# Patient Record
Sex: Male | Born: 1940 | Race: Black or African American | Hispanic: No | Marital: Married | Smoking: Current some day smoker
Health system: Southern US, Community
[De-identification: ages and names within clinical notes are randomized; demographics above are authoritative.]

## PROBLEM LIST (undated history)

## (undated) DIAGNOSIS — I10 Essential (primary) hypertension: Secondary | ICD-10-CM

## (undated) DIAGNOSIS — I2510 Atherosclerotic heart disease of native coronary artery without angina pectoris: Secondary | ICD-10-CM

## (undated) DIAGNOSIS — M109 Gout, unspecified: Secondary | ICD-10-CM

## (undated) DIAGNOSIS — E669 Obesity, unspecified: Secondary | ICD-10-CM

## (undated) DIAGNOSIS — I429 Cardiomyopathy, unspecified: Secondary | ICD-10-CM

## (undated) DIAGNOSIS — I639 Cerebral infarction, unspecified: Secondary | ICD-10-CM

## (undated) DIAGNOSIS — C189 Malignant neoplasm of colon, unspecified: Secondary | ICD-10-CM

## (undated) HISTORY — DX: Essential (primary) hypertension: I10

## (undated) HISTORY — PX: COLON SURGERY: SHX602

## (undated) HISTORY — DX: Cardiomyopathy, unspecified: I42.9

## (undated) HISTORY — DX: Atherosclerotic heart disease of native coronary artery without angina pectoris: I25.10

---

## 2000-11-13 ENCOUNTER — Ambulatory Visit (HOSPITAL_COMMUNITY): Admission: RE | Admit: 2000-11-13 | Discharge: 2000-11-13 | Payer: Self-pay | Admitting: General Surgery

## 2000-11-14 ENCOUNTER — Inpatient Hospital Stay (HOSPITAL_COMMUNITY): Admission: RE | Admit: 2000-11-14 | Discharge: 2000-11-20 | Payer: Self-pay | Admitting: General Surgery

## 2000-11-17 ENCOUNTER — Encounter: Payer: Self-pay | Admitting: Orthopedic Surgery

## 2000-11-20 ENCOUNTER — Encounter: Payer: Self-pay | Admitting: General Surgery

## 2000-12-26 ENCOUNTER — Encounter: Admission: RE | Admit: 2000-12-26 | Discharge: 2000-12-26 | Payer: Self-pay | Admitting: Oncology

## 2000-12-26 ENCOUNTER — Encounter (HOSPITAL_COMMUNITY): Admission: RE | Admit: 2000-12-26 | Discharge: 2001-01-25 | Payer: Self-pay | Admitting: Oncology

## 2001-06-29 ENCOUNTER — Encounter (HOSPITAL_COMMUNITY): Admission: RE | Admit: 2001-06-29 | Discharge: 2001-07-29 | Payer: Self-pay | Admitting: Oncology

## 2001-09-07 ENCOUNTER — Encounter: Admission: RE | Admit: 2001-09-07 | Discharge: 2001-09-07 | Payer: Self-pay | Admitting: Oncology

## 2001-09-07 ENCOUNTER — Encounter (HOSPITAL_COMMUNITY): Admission: RE | Admit: 2001-09-07 | Discharge: 2001-10-07 | Payer: Self-pay | Admitting: Oncology

## 2001-11-25 ENCOUNTER — Ambulatory Visit (HOSPITAL_COMMUNITY): Admission: RE | Admit: 2001-11-25 | Discharge: 2001-11-25 | Payer: Self-pay | Admitting: General Surgery

## 2001-12-29 ENCOUNTER — Encounter (HOSPITAL_COMMUNITY): Admission: RE | Admit: 2001-12-29 | Discharge: 2002-01-28 | Payer: Self-pay | Admitting: Oncology

## 2001-12-29 ENCOUNTER — Encounter: Admission: RE | Admit: 2001-12-29 | Discharge: 2001-12-29 | Payer: Self-pay | Admitting: Oncology

## 2002-01-29 ENCOUNTER — Encounter: Admission: RE | Admit: 2002-01-29 | Discharge: 2002-01-29 | Payer: Self-pay | Admitting: Oncology

## 2002-07-20 ENCOUNTER — Encounter (HOSPITAL_COMMUNITY): Admission: RE | Admit: 2002-07-20 | Discharge: 2002-08-19 | Payer: Self-pay | Admitting: Oncology

## 2002-07-20 ENCOUNTER — Encounter: Admission: RE | Admit: 2002-07-20 | Discharge: 2002-07-20 | Payer: Self-pay | Admitting: Oncology

## 2003-01-24 ENCOUNTER — Encounter (HOSPITAL_COMMUNITY): Admission: RE | Admit: 2003-01-24 | Discharge: 2003-01-27 | Payer: Self-pay | Admitting: Oncology

## 2003-01-24 ENCOUNTER — Encounter: Admission: RE | Admit: 2003-01-24 | Discharge: 2003-01-24 | Payer: Self-pay | Admitting: Oncology

## 2003-04-18 ENCOUNTER — Encounter (HOSPITAL_COMMUNITY): Admission: RE | Admit: 2003-04-18 | Discharge: 2003-05-18 | Payer: Self-pay | Admitting: Oncology

## 2003-04-18 ENCOUNTER — Encounter: Admission: RE | Admit: 2003-04-18 | Discharge: 2003-04-18 | Payer: Self-pay | Admitting: Oncology

## 2003-07-11 ENCOUNTER — Encounter (HOSPITAL_COMMUNITY): Admission: RE | Admit: 2003-07-11 | Discharge: 2003-08-10 | Payer: Self-pay | Admitting: Oncology

## 2003-07-11 ENCOUNTER — Encounter: Admission: RE | Admit: 2003-07-11 | Discharge: 2003-07-11 | Payer: Self-pay | Admitting: Oncology

## 2003-10-21 ENCOUNTER — Encounter: Admission: RE | Admit: 2003-10-21 | Discharge: 2003-10-21 | Payer: Self-pay | Admitting: Oncology

## 2003-10-21 ENCOUNTER — Encounter (HOSPITAL_COMMUNITY): Admission: RE | Admit: 2003-10-21 | Discharge: 2003-11-20 | Payer: Self-pay | Admitting: Oncology

## 2004-01-20 ENCOUNTER — Encounter (HOSPITAL_COMMUNITY): Admission: RE | Admit: 2004-01-20 | Discharge: 2004-01-27 | Payer: Self-pay | Admitting: Oncology

## 2004-01-20 ENCOUNTER — Encounter: Admission: RE | Admit: 2004-01-20 | Discharge: 2004-01-27 | Payer: Self-pay | Admitting: Oncology

## 2004-11-13 ENCOUNTER — Encounter: Admission: RE | Admit: 2004-11-13 | Discharge: 2004-11-13 | Payer: Self-pay | Admitting: Oncology

## 2004-11-13 ENCOUNTER — Ambulatory Visit (HOSPITAL_COMMUNITY): Payer: Self-pay | Admitting: Oncology

## 2004-11-13 ENCOUNTER — Encounter (HOSPITAL_COMMUNITY): Admission: RE | Admit: 2004-11-13 | Discharge: 2004-12-13 | Payer: Self-pay | Admitting: Oncology

## 2005-02-19 ENCOUNTER — Encounter: Admission: RE | Admit: 2005-02-19 | Discharge: 2005-02-19 | Payer: Self-pay | Admitting: Oncology

## 2005-02-19 ENCOUNTER — Ambulatory Visit (HOSPITAL_COMMUNITY): Payer: Self-pay | Admitting: Oncology

## 2005-02-19 ENCOUNTER — Encounter (HOSPITAL_COMMUNITY): Admission: RE | Admit: 2005-02-19 | Discharge: 2005-03-21 | Payer: Self-pay | Admitting: Oncology

## 2005-05-22 ENCOUNTER — Ambulatory Visit (HOSPITAL_COMMUNITY): Payer: Self-pay | Admitting: Oncology

## 2005-05-22 ENCOUNTER — Encounter (HOSPITAL_COMMUNITY): Admission: RE | Admit: 2005-05-22 | Discharge: 2005-06-21 | Payer: Self-pay | Admitting: Oncology

## 2005-05-22 ENCOUNTER — Encounter: Admission: RE | Admit: 2005-05-22 | Discharge: 2005-05-22 | Payer: Self-pay | Admitting: Oncology

## 2005-06-25 ENCOUNTER — Ambulatory Visit (HOSPITAL_COMMUNITY): Admission: RE | Admit: 2005-06-25 | Discharge: 2005-06-25 | Payer: Self-pay | Admitting: General Surgery

## 2005-08-20 ENCOUNTER — Encounter: Admission: RE | Admit: 2005-08-20 | Discharge: 2005-08-20 | Payer: Self-pay | Admitting: Oncology

## 2005-08-20 ENCOUNTER — Ambulatory Visit (HOSPITAL_COMMUNITY): Payer: Self-pay | Admitting: Oncology

## 2005-08-20 ENCOUNTER — Encounter (HOSPITAL_COMMUNITY): Admission: RE | Admit: 2005-08-20 | Discharge: 2005-09-19 | Payer: Self-pay | Admitting: Oncology

## 2008-01-12 ENCOUNTER — Emergency Department (HOSPITAL_COMMUNITY): Admission: EM | Admit: 2008-01-12 | Discharge: 2008-01-13 | Payer: Self-pay | Admitting: Emergency Medicine

## 2010-09-14 NOTE — H&P (Signed)
NAME:  Carlos Joseph, Carlos Joseph NO.:  [id number]   MEDICAL RECORD NO.:  [id number]           PATIENT TYPE:   LOCATION:                                 FACILITY:   PHYSICIAN:  Dalia Heading, M.D.       DATE OF BIRTH:   DATE OF ADMISSION:  06/25/2005  DATE OF DISCHARGE:  LH                                HISTORY & PHYSICAL   CHIEF COMPLAINT:  History of colon carcinoma.   HISTORY OF PRESENT ILLNESS:  The patient is a 70 year old black male status  post a right hemicolectomy in July 2002 for a Duke's B2 adenocarcinoma of  the cecum who now presents for followup colonoscopy. He denies any  hematochezia, nausea, vomiting, fever, weight loss or lightheadedness.   PAST MEDICAL HISTORY:  Hypertension.   PAST SURGICAL HISTORY:  As noted above.   CURRENT MEDICATIONS:  Blood pressure pill.   ALLERGIES:  No known drug allergies.   REVIEW OF SYSTEMS:  The patient denies any cardiopulmonary difficulties or  bleeding disorders.   PHYSICAL EXAMINATION:  GENERAL:  The patient is a well-developed, well-  nourished, black male in no acute distress.  LUNGS:  Clear to auscultation with equal breath sounds bilaterally.  HEART:  Reveals regular rate and rhythm without S3, S4, or murmurs.  ABDOMEN:  Soft, nontender, nondistended. No hepatosplenomegaly or masses are  noted.  RECTAL:  Deferred to the procedure.   IMPRESSION:  History of colon carcinoma.   PLAN:  The patient is scheduled for colonoscopy on June 25, 2005. The  risks and benefits of the procedure including bleeding and perforation were  fully explained to the patient, who gave informed consent.      Dalia Heading, M.D.  Electronically Signed     MAJ/MEDQ  D:  06/06/2005  T:  06/06/2005  Job:  [id number]   cc:   [facility]  Fax: [phone]   Ladona Horns. Mariel Sleet, MD  Fax: [phone]   Angus G. Renard Matter, MD  Fax: [phone]

## 2010-09-14 NOTE — H&P (Signed)
[facility]  Patient:    Carlos Joseph, Carlos Joseph Visit Number: [id number] MRN: [id number]          Service Type: REC Location: [facility] Attending Physician:  Elisabeth Most Dictated by:   Franky Macho, M.D. Admit Date:  09/07/2001 Discharge Date: 10/07/2001   CC:         Butch Penny, M.D.  Unk Pinto, M.D.   History and Physical  AGE:  70  CHIEF COMPLAINT:  Colon carcinoma.  HISTORY OF PRESENT ILLNESS:  The patient is a 70 year old black male who is status post a right hemicolectomy in July of 2002, who now presents for a follow up colonoscopy.  He had a Dukes B-II lesion.  He did not receive chemotherapy.  He states that his only complaint is that he goes to the bathroom frequently.  Denies any hematochezia, nausea, vomiting, fever, weight loss, or light-headedness.  PAST MEDICAL HISTORY:  Unremarkable.  PAST SURGICAL HISTORY:  As noted above.  CURRENT MEDICATIONS:  None listed.  ALLERGIES:  No known drug allergies.  REVIEW OF SYSTEMS:  He does occasionally smoke cigars.  He denies any significant alcohol use.  He denies any other cardiopulmonary difficulties or bleeding disorders.  PHYSICAL EXAMINATION:  GENERAL:  The patient is a well-developed and well-nourished black male in no acute distress.  VITAL SIGNS:  He is afebrile and vital signs are stable.  LUNGS:  Clear to auscultation with equal breath sounds bilaterally.  HEART:  Examination reveals a regular rate and rhythm without S3, S4, or murmurs.  ABDOMEN:  Soft, nontender, and nondistended.  No hepatosplenomegaly, masses, or hernias are identified.  RECTAL:  Examination was deferred until the procedure.  IMPRESSION:  Colon carcinoma.  PLAN:  The patient is scheduled to undergo a colonoscopy on November 25, 2001. The risks and benefits of the procedure including bleeding and perforation were fully explained to the patient, who gave informed consent. Dictated by:   Franky Macho, M.D. Attending Physician:  Elisabeth Most DD:  11/12/01 TD:  11/16/01 Job: [id number]

## 2012-04-01 ENCOUNTER — Inpatient Hospital Stay (HOSPITAL_COMMUNITY)
Admission: EM | Admit: 2012-04-01 | Discharge: 2012-04-04 | DRG: 250 | Disposition: A | Discharge status: Home Health | Payer: Medicare Other | Attending: Internal Medicine | Admitting: Internal Medicine

## 2012-04-01 ENCOUNTER — Encounter (HOSPITAL_COMMUNITY): Payer: Self-pay | Admitting: Emergency Medicine

## 2012-04-01 ENCOUNTER — Emergency Department (HOSPITAL_COMMUNITY): Payer: Medicare Other

## 2012-04-01 DIAGNOSIS — R0902 Hypoxemia: Secondary | ICD-10-CM

## 2012-04-01 DIAGNOSIS — Z955 Presence of coronary angioplasty implant and graft: Secondary | ICD-10-CM

## 2012-04-01 DIAGNOSIS — J810 Acute pulmonary edema: Secondary | ICD-10-CM

## 2012-04-01 DIAGNOSIS — I2510 Atherosclerotic heart disease of native coronary artery without angina pectoris: Secondary | ICD-10-CM | POA: Diagnosis present

## 2012-04-01 DIAGNOSIS — J9600 Acute respiratory failure, unspecified whether with hypoxia or hypercapnia: Secondary | ICD-10-CM | POA: Diagnosis present

## 2012-04-01 DIAGNOSIS — I429 Cardiomyopathy, unspecified: Secondary | ICD-10-CM | POA: Diagnosis present

## 2012-04-01 DIAGNOSIS — F17200 Nicotine dependence, unspecified, uncomplicated: Secondary | ICD-10-CM | POA: Diagnosis present

## 2012-04-01 DIAGNOSIS — E876 Hypokalemia: Secondary | ICD-10-CM | POA: Diagnosis not present

## 2012-04-01 DIAGNOSIS — I240 Acute coronary thrombosis not resulting in myocardial infarction: Secondary | ICD-10-CM

## 2012-04-01 DIAGNOSIS — I10 Essential (primary) hypertension: Secondary | ICD-10-CM | POA: Diagnosis present

## 2012-04-01 DIAGNOSIS — E669 Obesity, unspecified: Secondary | ICD-10-CM | POA: Diagnosis present

## 2012-04-01 DIAGNOSIS — D649 Anemia, unspecified: Secondary | ICD-10-CM

## 2012-04-01 DIAGNOSIS — Z85038 Personal history of other malignant neoplasm of large intestine: Secondary | ICD-10-CM

## 2012-04-01 DIAGNOSIS — I509 Heart failure, unspecified: Secondary | ICD-10-CM | POA: Diagnosis present

## 2012-04-01 DIAGNOSIS — I5021 Acute systolic (congestive) heart failure: Principal | ICD-10-CM | POA: Diagnosis present

## 2012-04-01 DIAGNOSIS — R0602 Shortness of breath: Secondary | ICD-10-CM

## 2012-04-01 DIAGNOSIS — I517 Cardiomegaly: Secondary | ICD-10-CM

## 2012-04-01 DIAGNOSIS — D539 Nutritional anemia, unspecified: Secondary | ICD-10-CM | POA: Diagnosis present

## 2012-04-01 DIAGNOSIS — J9801 Acute bronchospasm: Secondary | ICD-10-CM

## 2012-04-01 DIAGNOSIS — Z79899 Other long term (current) drug therapy: Secondary | ICD-10-CM

## 2012-04-01 DIAGNOSIS — I160 Hypertensive urgency: Secondary | ICD-10-CM

## 2012-04-01 HISTORY — DX: Obesity, unspecified: E66.9

## 2012-04-01 HISTORY — DX: Malignant neoplasm of colon, unspecified: C18.9

## 2012-04-01 LAB — FERRITIN: Ferritin: 364 ng/mL — ABNORMAL HIGH (ref 22–322)

## 2012-04-01 LAB — RETICULOCYTES
RBC.: 3.36 MIL/uL — ABNORMAL LOW (ref 4.22–5.81)
Retic Ct Pct: 3 % (ref 0.4–3.1)

## 2012-04-01 LAB — CBC WITH DIFFERENTIAL/PLATELET
Basophils Relative: 0 % (ref 0–1)
Eosinophils Relative: 1 % (ref 0–5)
HCT: 34.5 % — ABNORMAL LOW (ref 39.0–52.0)
Hemoglobin: 11.6 g/dL — ABNORMAL LOW (ref 13.0–17.0)
MCHC: 33.6 g/dL (ref 30.0–36.0)
MCV: 102.1 fL — ABNORMAL HIGH (ref 78.0–100.0)
Monocytes Absolute: 0.6 10*3/uL (ref 0.1–1.0)
Monocytes Relative: 8 % (ref 3–12)
Neutro Abs: 5.7 10*3/uL (ref 1.7–7.7)

## 2012-04-01 LAB — IRON AND TIBC: Iron: 46 ug/dL (ref 42–135)

## 2012-04-01 LAB — FOLATE: Folate: >20 ng/mL

## 2012-04-01 LAB — PRO B NATRIURETIC PEPTIDE: Pro B Natriuretic peptide (BNP): 8916 pg/mL — ABNORMAL HIGH (ref 0–125)

## 2012-04-01 LAB — COMPREHENSIVE METABOLIC PANEL
Albumin: 2.9 g/dL — ABNORMAL LOW (ref 3.5–5.2)
BUN: 12 mg/dL (ref 6–23)
CO2: 22 mEq/L (ref 19–32)
Chloride: 109 mEq/L (ref 96–112)
Creatinine, Ser: 1.04 mg/dL (ref 0.50–1.35)
GFR calc non Af Amer: 69 mL/min — ABNORMAL LOW (ref >90)
Total Bilirubin: 0.5 mg/dL (ref 0.3–1.2)

## 2012-04-01 LAB — TSH: TSH: 0.703 u[IU]/mL (ref 0.350–4.500)

## 2012-04-01 LAB — HEMOGLOBIN A1C: Mean Plasma Glucose: 111 mg/dL (ref <117)

## 2012-04-01 LAB — TROPONIN I
Troponin I: <0.3 ng/mL (ref <0.30)
Troponin I: <0.3 ng/mL (ref <0.30)

## 2012-04-01 MED ORDER — HEPARIN SODIUM (PORCINE) 5000 UNIT/ML IJ SOLN
5000.0000 [IU] | Freq: Three times a day (TID) | INTRAMUSCULAR | Status: DC
  Administered 2012-04-01 – 2012-04-03 (×6): 5000 [IU] via SUBCUTANEOUS
  Filled 2012-04-01 (×6): qty 1

## 2012-04-01 MED ORDER — SODIUM CHLORIDE 0.9 % IJ SOLN: 3.0000 mL | INTRAMUSCULAR | Status: DC | PRN

## 2012-04-01 MED ORDER — IPRATROPIUM BROMIDE 0.02 % IN SOLN
0.5000 mg | Freq: Once | RESPIRATORY_TRACT | Status: AC
  Administered 2012-04-01: 0.5 mg via RESPIRATORY_TRACT
  Filled 2012-04-01: qty 2.5

## 2012-04-01 MED ORDER — NITROGLYCERIN IN D5W 200-5 MCG/ML-% IV SOLN
5.0000 ug/min | Freq: Once | INTRAVENOUS | Status: AC
  Administered 2012-04-01: 5 ug/min via INTRAVENOUS
  Filled 2012-04-01: qty 250

## 2012-04-01 MED ORDER — NITROGLYCERIN IN D5W 200-5 MCG/ML-% IV SOLN
2.0000 ug/min | INTRAVENOUS | Status: DC
Start: 2012-04-01 — End: 2012-04-02

## 2012-04-01 MED ORDER — ACETAMINOPHEN 325 MG PO TABS: 650.0000 mg | ORAL_TABLET | ORAL | Status: DC | PRN

## 2012-04-01 MED ORDER — ONDANSETRON HCL 4 MG/2ML IJ SOLN: 4.0000 mg | Freq: Four times a day (QID) | INTRAMUSCULAR | Status: DC | PRN

## 2012-04-01 MED ORDER — LISINOPRIL 5 MG PO TABS
5.0000 mg | ORAL_TABLET | Freq: Every day | ORAL | Status: DC
  Administered 2012-04-01: 5 mg via ORAL
  Filled 2012-04-01 (×4): qty 1

## 2012-04-01 MED ORDER — ALBUTEROL SULFATE (5 MG/ML) 0.5% IN NEBU
5.0000 mg | INHALATION_SOLUTION | Freq: Once | RESPIRATORY_TRACT | Status: AC
  Administered 2012-04-01: 5 mg via RESPIRATORY_TRACT
  Filled 2012-04-01: qty 1

## 2012-04-01 MED ORDER — CARVEDILOL 3.125 MG PO TABS
3.1250 mg | ORAL_TABLET | Freq: Two times a day (BID) | ORAL | Status: DC
  Administered 2012-04-01 – 2012-04-02 (×3): 3.125 mg via ORAL
  Filled 2012-04-01 (×3): qty 1

## 2012-04-01 MED ORDER — FUROSEMIDE 10 MG/ML IJ SOLN: 60.0000 mg | Freq: Once | INTRAMUSCULAR | Status: DC

## 2012-04-01 MED ORDER — SODIUM CHLORIDE 0.9 % IJ SOLN
3.0000 mL | Freq: Two times a day (BID) | INTRAMUSCULAR | Status: DC
  Administered 2012-04-01 – 2012-04-03 (×5): 3 mL via INTRAVENOUS

## 2012-04-01 MED ORDER — FUROSEMIDE 10 MG/ML IJ SOLN
60.0000 mg | Freq: Two times a day (BID) | INTRAMUSCULAR | Status: DC
  Administered 2012-04-01 – 2012-04-02 (×2): 60 mg via INTRAVENOUS
  Filled 2012-04-01 (×3): qty 6

## 2012-04-01 MED ORDER — FUROSEMIDE 10 MG/ML IJ SOLN
80.0000 mg | Freq: Once | INTRAMUSCULAR | Status: AC
  Administered 2012-04-01: 80 mg via INTRAVENOUS
  Filled 2012-04-01: qty 8

## 2012-04-01 MED ORDER — METHYLPREDNISOLONE SODIUM SUCC 125 MG IJ SOLR
125.0000 mg | Freq: Once | INTRAMUSCULAR | Status: AC
  Administered 2012-04-01: 125 mg via INTRAVENOUS
  Filled 2012-04-01: qty 2

## 2012-04-01 NOTE — H&P (Signed)
[facility] History and Physical  Carlos Joseph AVW:[id number] DOB: 09-22-1940 DOA: 04/01/2012   Chief Complaint: Dyspnea.  HPI: Carlos Joseph is a 71 y.o. male presents with the above symptoms for the last 3 days. He denies any chest pain, palpitations. He does describe some chest tightness but this was related to his dyspnea. There is a nonproductive cough and he has been wheezing. When he came to the emergency room, he was initially treated with bronchodilators and IV steroids. Chest x-ray, however was revealing for pulmonary edema and he has now been given intravenous Lasix and is on a nitroglycerin drip, he feels improved. He does have a history of hypertension and has not seen a physician for many years and does not take any antihypertensive medications. Currently, his blood pressure is significantly elevated.   Review of Systems:  Apart from history of present illness, other systems negative.  Past Medical History  Diagnosis Date  . Hypertension   . Cancer, colon cancer.     Past Surgical History  Procedure Date  . Colon surgery    Social History:  He is married and lives with his wife. He does not smoke cigarettes but he chews tobacco. He occasionally drinks alcohol.  No Known Allergies  History reviewed. No pertinent family history. no family history of early coronary artery disease.  Prior to Admission medications   Medication Sig Start Date End Date Taking? Authorizing Provider  fish oil-omega-3 fatty acids 1000 MG capsule Take 2 g by mouth daily.   Yes Historical Provider, MD  Multiple Vitamin (MULTIVITAMIN WITH MINERALS) TABS Take 1 tablet by mouth daily.   Yes Historical Provider, MD  Pseudoeph-Doxylamine-DM-APAP (DAYQUIL/NYQUIL COLD/FLU RELIEF PO) Take 2 tablets by mouth every 4 (four) hours as needed. Cold Symptoms   Yes Historical Provider, MD   Physical Exam: Filed Vitals:   04/01/12 1029 04/01/12 1053 04/01/12 1100 04/01/12 1130  BP:  169/129 174/112  175/113  Pulse:  110    Temp:      Resp:  24 25 26   Height:      Weight:      SpO2: 95%        General:  He looks systemically well. He is obese. He is able to speak in sentences.  Eyes: No pallor. No jaundice.  ENT: No abnormalities.  Neck: No lymphadenopathy.  Cardiovascular: Heart sounds are present with a resting sinus tachycardia. He does not appear to have a gallop rhythm. Jugular venous pressure is not elevated.  Respiratory: Lung fields show scattered wheezing, otherwise clear. There are no significant crackles.  Abdomen: Soft, nontender, no hepatosplenomegaly.  Skin: No rashes.  Musculoskeletal: No major abnormalities.  Psychiatric: Appropriate affect.  Neurologic: Alert and orientated, no focal neurological signs.  Labs on Admission:  Basic Metabolic Panel:  Lab 04/01/12 8295  NA 141  K 3.5  CL 109  CO2 22  GLUCOSE 138*  BUN 12  CREATININE 1.04  CALCIUM 9.0  MG --  PHOS --   Liver Function Tests:  Lab 04/01/12 0818  AST 32  ALT 25  ALKPHOS 96  BILITOT 0.5  PROT 6.6  ALBUMIN 2.9*     CBC:  Lab 04/01/12 0818  WBC 7.4  NEUTROABS 5.7  HGB 11.6*  HCT 34.5*  MCV 102.1*  PLT 222   Cardiac Enzymes:  Lab 04/01/12 0818  CKTOTAL --  CKMB --  CKMBINDEX --  TROPONINI <0.30    BNP (last 3 results)  Basename 04/01/12 0818  PROBNP  8916.0*     Radiological Exams on Admission: Dg Chest Portable 1 View  04/01/2012  *RADIOLOGY REPORT*  Clinical Data: Shortness of breath, history hypertension, carcinoma of the colon  PORTABLE CHEST - 1 VIEW  Comparison: Portable exam 0937 hours without priors for comparison. Correlation:  CT chest 07/12/2003  Findings: Enlargement of cardiac silhouette. Tortuous aorta with atherosclerotic calcification. Bilateral perihilar to basilar infiltrates question edema versus infection. No pleural effusion or pneumothorax. Bones unremarkable.  IMPRESSION: Enlargement of cardiac silhouette. Bilateral perihilar to  basilar infiltrates question pulmonary edema versus infection.   Original Report Authenticated By: Ulyses Southward, M.D.     EKG: Independently reviewed. 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  Assessment/Plan   1. Acute congestive heart failure. 2. Uncontrolled hypertension. 3. Obesity. 4. Microcytic anemia. Plan: 1. Admit to step down unit for today. 2. Intravenous diuretics. ACE inhibitor. Beta blocker. 3. Investigate microcytic anemia. 4. Cardiology consultation. Echocardiogram. Serial cardiac enzymes. Further recommendations will depend on patient's hospital progress.    Code Status: Full code.  Family Communication: Discussed plan with patient and his wife at the bedside.   Disposition Plan: Home in medically stable.   Time spent: 45 minutes.  Wilson Singer [facility] Pager [phone].  If 7PM-7AM, please contact night-coverage [email or web address] Password [email or web address] 04/01/2012, 11:58 AM

## 2012-04-01 NOTE — Progress Notes (Signed)
*  PRELIMINARY RESULTS* Echocardiogram 2D Echocardiogram has been performed.  Carlos Joseph 04/01/2012, 2:31 PM

## 2012-04-01 NOTE — ED Provider Notes (Signed)
History   This chart was scribed for Ward Givens, MD by Charolett Bumpers, ED Scribe. The patient was seen in room APA07/APA07. Patient's care was started at 0904.   CSN: [id number]  Arrival date & time 04/01/12  4098   First MD Initiated Contact with Patient 04/01/12 [phone]      Chief Complaint  Patient presents with  . Shortness of Breath  . Chest Pain    Level V caveat for respiratory distress  Carlos Joseph is a 71 y.o. male who presents to the Emergency Department complaining of SOB that started over the past week. He reports an associated dry cough, wheezing, chest tightness, fever and chills. He denies any chest pain, leg swelling, dizziness, light-headedness. He states his chest tightness is aggravated with coughing. He also reports diaphoresis at night. He denies any h/o similar symptoms. He denies any aggravation of his symptoms with laying down or ambulating. He is not taking any medications for HTN currently and hasn't seen a doctor or taken his blood pressure medication for several years.  Patient is a 71 y.o. male presenting with shortness of breath. The history is provided by the patient and the spouse. No language interpreter was used.  Shortness of Breath  The current episode started 5 to 7 days ago. The onset was gradual. The problem is moderate. Nothing relieves the symptoms. Associated symptoms include a fever, cough, shortness of breath and wheezing. Pertinent negatives include no chest pain.    PCP: Dr. Margo Aye and Renard Matter (has not seen in years).   Past Medical History  Diagnosis Date  . Hypertension   . Cancer     Past Surgical History  Procedure Date  . Colon surgery     History reviewed. No pertinent family history.  History  Substance Use Topics  . Smoking status: Current Every Day Smoker    Types: Cigars  . Smokeless tobacco: Current User    Types: Chew  . Alcohol Use: Yes     Comment: liquor-half pint twice a week   He smokes and chews  cigars daily.  Lives at home Lives with spouse Drinks 1/2 pint a day  Review of Systems  Constitutional: Positive for fever, chills and diaphoresis.  Respiratory: Positive for cough, chest tightness, shortness of breath and wheezing.   Cardiovascular: Negative for chest pain and leg swelling.  Neurological: Negative for dizziness and light-headedness.  All other systems reviewed and are negative.    Allergies  Review of patient's allergies indicates no known allergies.  Home Medications  No current outpatient prescriptions on file.  BP 180/117  Pulse 123  Temp 98.6 F (37 C)  Resp 28  Ht 5\' 11"  (1.803 m)  Wt 225 lb (102.059 kg)  BMI 31.38 kg/m2  SpO2 91%  Vital signs normal except hypertension, tachycardia, borderline hypoxia on pulse ox   Physical Exam  Nursing note and vitals reviewed. Constitutional: He is oriented to person, place, and time. He appears well-developed and well-nourished. No distress.       obese  HENT:  Head: Normocephalic and atraumatic.  Right Ear: External ear normal.  Left Ear: External ear normal.  Mouth/Throat: Oropharynx is clear and moist.  Eyes: Conjunctivae normal and EOM are normal. Pupils are equal, round, and reactive to light.  Neck: Normal range of motion. Neck supple. No tracheal deviation present.  Cardiovascular: Normal rate, regular rhythm and normal heart sounds.   No murmur heard. Pulmonary/Chest: Effort normal. No respiratory distress. He has wheezes.  He has no rales.       Retractions, diffuse prolonged expiratory wheezes, audible wheezing, has difficulty speaking b/o SOB   Abdominal: Soft. Bowel sounds are normal. He exhibits distension. There is no tenderness. There is no rebound and no guarding.       Abdominal appears distended, but is soft.   Musculoskeletal: Normal range of motion. He exhibits edema. He exhibits no tenderness.       Trace pitting edema in lower extremities.   Neurological: He is alert and oriented  to person, place, and time.  Skin: Skin is warm and dry.  Psychiatric: He has a normal mood and affect. His behavior is normal.    ED Course  Procedures (including critical care time)   Medications  albuterol (PROVENTIL) (5 MG/ML) 0.5% nebulizer solution 5 mg (5 mg Nebulization Given 04/01/12 0942)  ipratropium (ATROVENT) nebulizer solution 0.5 mg (0.5 mg Nebulization Given 04/01/12 0942)  nitroGLYCERIN 0.2 mg/mL in dextrose 5 % infusion (10 mcg/min Intravenous Rate/Dose Change 04/01/12 1052)  methylPREDNISolone sodium succinate (SOLU-MEDROL) 125 mg/2 mL injection 125 mg (125 mg Intravenous Given 04/01/12 1000)  albuterol (PROVENTIL) (5 MG/ML) 0.5% nebulizer solution 5 mg (5 mg Nebulization Given 04/01/12 1028)  ipratropium (ATROVENT) nebulizer solution 0.5 mg (0.5 mg Nebulization Given 04/01/12 1028)  furosemide (LASIX) injection 80 mg (80 mg Intravenous Given 04/01/12 1046)     DIAGNOSTIC STUDIES: Oxygen Saturation is 91% on room air, borderline by my interpretation.    COORDINATION OF CARE:  09:30-Discussed planned course of treatment with the patient including a breathing treatment, steroids, chest x-ray, and blood work, who is agreeable at this time.   10:19-Recheck; Pt is breathing deeper and with better movement after breathing treatment. He is speaking in more complete sentences. He still has some end expiratory wheezing on exam. Informed pt of imaging and lab results. Will consult for admission. Pt is agreeable with plan. During conversation, pt started having audible wheezing again. Is upset about having to be admitted and his diagnosis. Will add lasix and additional nebulizer.   11:11 Dr Karilyn Cota will admit, he will see in ED and do orders  Results for orders placed during the hospital encounter of 04/01/12  CBC WITH DIFFERENTIAL      Component Value Range   WBC 7.4  4.0 - 10.5 K/uL   RBC 3.38 (*) 4.22 - 5.81 MIL/uL   Hemoglobin 11.6 (*) 13.0 - 17.0 g/dL   HCT 16.1 (*) 09.6 -  52.0 %   MCV 102.1 (*) 78.0 - 100.0 fL   MCH 34.3 (*) 26.0 - 34.0 pg   MCHC 33.6  30.0 - 36.0 g/dL   RDW 04.5 (*) 40.9 - 81.1 %   Platelets 222  150 - 400 K/uL   Neutrophils Relative 76  43 - 77 %   Neutro Abs 5.7  1.7 - 7.7 K/uL   Lymphocytes Relative 14  12 - 46 %   Lymphs Abs 1.1  0.7 - 4.0 K/uL   Monocytes Relative 8  3 - 12 %   Monocytes Absolute 0.6  0.1 - 1.0 K/uL   Eosinophils Relative 1  0 - 5 %   Eosinophils Absolute 0.1  0.0 - 0.7 K/uL   Basophils Relative 0  0 - 1 %   Basophils Absolute 0.0  0.0 - 0.1 K/uL  COMPREHENSIVE METABOLIC PANEL      Component Value Range   Sodium 141  135 - 145 mEq/L   Potassium 3.5  3.5 - 5.1  mEq/L   Chloride 109  96 - 112 mEq/L   CO2 22  19 - 32 mEq/L   Glucose, Bld 138 (*) 70 - 99 mg/dL   BUN 12  6 - 23 mg/dL   Creatinine, Ser 1.32  0.50 - 1.35 mg/dL   Calcium 9.0  8.4 - 44.0 mg/dL   Total Protein 6.6  6.0 - 8.3 g/dL   Albumin 2.9 (*) 3.5 - 5.2 g/dL   AST 32  0 - 37 U/L   ALT 25  0 - 53 U/L   Alkaline Phosphatase 96  39 - 117 U/L   Total Bilirubin 0.5  0.3 - 1.2 mg/dL   GFR calc non Af Amer 69 (*) >90 mL/min   GFR calc Af Amer 80 (*) >90 mL/min  PRO B NATRIURETIC PEPTIDE      Component Value Range   Pro B Natriuretic peptide (BNP) 8916.0 (*) 0 - 125 pg/mL  TROPONIN I      Component Value Range   Troponin I <0.30  <0.30 ng/mL   Laboratory interpretation all normal except elevated BNP, mild hyperglycemia, mild anemia   Dg Chest Portable 1 View  04/01/2012  *RADIOLOGY REPORT*  Clinical Data: Shortness of breath, history hypertension, carcinoma of the colon  PORTABLE CHEST - 1 VIEW  Comparison: Portable exam 0937 hours without priors for comparison. Correlation:  CT chest 07/12/2003  Findings: Enlargement of cardiac silhouette. Tortuous aorta with atherosclerotic calcification. Bilateral perihilar to basilar infiltrates question edema versus infection. No pleural effusion or pneumothorax. Bones unremarkable.  IMPRESSION: Enlargement of  cardiac silhouette. Bilateral perihilar to basilar infiltrates question pulmonary edema versus infection.   Original Report Authenticated By: Ulyses Southward, M.D.     Date: 04/01/2012  Rate: 122  Rhythm: sinus tachycardia  QRS Axis: normal  Intervals: normal  ST/T Wave abnormalities: normal  Conduction Disutrbances:none  Narrative Interpretation: Q waves anterior leads  Old EKG Reviewed: none available     1. SOB (shortness of breath)   2. Bronchospasm   3. CHF (congestive heart failure)   4. Anemia   5. Hypoxia     Plan admission  CRITICAL CARE Performed by: Devoria Albe L   Total critical care time: 44 min  Critical care time was exclusive of separately billable procedures and treating other patients.  Critical care was necessary to treat or prevent imminent or life-threatening deterioration.  Critical care was time spent personally by me on the following activities: development of treatment plan with patient and/or surrogate as well as nursing, discussions with consultants, evaluation of patient's response to treatment, examination of patient, obtaining history from patient or surrogate, ordering and performing treatments and interventions, ordering and review of laboratory studies, ordering and review of radiographic studies, pulse oximetry and re-evaluation of patient's condition.   MDM  I personally performed the services described in this documentation, which was scribed in my presence. The recorded information has been reviewed and considered.  Devoria Albe, MD, [facility]ando [facility]      Ward Givens, MD 04/01/12 [phone]

## 2012-04-01 NOTE — ED Notes (Addendum)
Lasix 80mg  pushed over 4.5 minutes. Pt placed on [place] 02 2l due to low sats of 91-93%ra

## 2012-04-01 NOTE — ED Notes (Signed)
Pt c/o prod cough with intermittant phlegm that is yellow x 1 week with wheezing. Chest tightness since coughing started. Slight accessory muscle use at this time. No resp distress but does appear sob at this time slightly

## 2012-04-01 NOTE — Consult Note (Signed)
Reason for Consult:pulmonary edema Referring Physician: Jaimie Joseph is an 71 y.o. male.  HPI: This is a 71 year old male patient with history of hypertension and colon cancer who presents to the emergency room with dyspnea and was found to be in pulmonary edema.The patient hasn't been seen by Dr. In 10-12 years.He states he had a cold about 3 weeks ago with cough, sweating, and shortness of breath. He would take NyQuil and other over-the-counter medications and feels like the cough would break up. He thought he was getting better but the past several days he had progressive dyspnea on exertion. He denies orthopnea, paroxysmal nocturnal dyspnea, chest pain or tightness, palpitations, dizziness, or presyncope.Patient was given Lasix 80 mg IV in the emergency room and is beginning to diurese. He is also on IV nitroglycerin.Initial troponin is negative, and BNP is 8916.  Cardiac risk factors include hypertension, he smokes cigars. He denies a family history of coronary artery disease, hyperlipidemia, or diabetes mellitus.  Past Medical History  Diagnosis Date  . Hypertension   . Cancer   . CHF (congestive heart failure)     Past Surgical History  Procedure Date  . Colon surgery     History reviewed. No pertinent family history.  Social History:  reports that he has been smoking Cigars.  His smokeless tobacco use includes Chew. He reports that he drinks alcohol. He reports that he does not use illicit drugs.  Allergies: No Known Allergies  Medications:  Scheduled Meds:   . [COMPLETED] albuterol  5 mg Nebulization Once  . [COMPLETED] albuterol  5 mg Nebulization Once  . carvedilol  3.125 mg Oral BID WC  . furosemide  60 mg Intravenous Q12H  . [COMPLETED] furosemide  80 mg Intravenous Once  . heparin  5,000 Units Subcutaneous Q8H  . [COMPLETED] ipratropium  0.5 mg Nebulization Once  . [COMPLETED] ipratropium  0.5 mg Nebulization Once  . lisinopril  5 mg Oral Daily  .  [COMPLETED] methylPREDNISolone sodium succinate  125 mg Intravenous Once  . [COMPLETED] nitroGLYCERIN  5-200 mcg/min Intravenous Once  . sodium chloride  3 mL Intravenous Q12H  . [DISCONTINUED] furosemide  60 mg Intravenous Once   Continuous Infusions:  PRN Meds:.acetaminophen, ondansetron (ZOFRAN) IV, sodium chloride   Results for orders placed during the hospital encounter of 04/01/12 (from the past 48 hour(s))  CBC WITH DIFFERENTIAL     Status: Abnormal   Collection Time   04/01/12  8:18 AM      Component Value Range Comment   WBC 7.4  4.0 - 10.5 K/uL    RBC 3.38 (*) 4.22 - 5.81 MIL/uL    Hemoglobin 11.6 (*) 13.0 - 17.0 g/dL    HCT 16.1 (*) 09.6 - 52.0 %    MCV 102.1 (*) 78.0 - 100.0 fL    MCH 34.3 (*) 26.0 - 34.0 pg    MCHC 33.6  30.0 - 36.0 g/dL    RDW 04.5 (*) 40.9 - 15.5 %    Platelets 222  150 - 400 K/uL    Neutrophils Relative 76  43 - 77 %    Neutro Abs 5.7  1.7 - 7.7 K/uL    Lymphocytes Relative 14  12 - 46 %    Lymphs Abs 1.1  0.7 - 4.0 K/uL    Monocytes Relative 8  3 - 12 %    Monocytes Absolute 0.6  0.1 - 1.0 K/uL    Eosinophils Relative 1  0 - 5 %  Eosinophils Absolute 0.1  0.0 - 0.7 K/uL    Basophils Relative 0  0 - 1 %    Basophils Absolute 0.0  0.0 - 0.1 K/uL   COMPREHENSIVE METABOLIC PANEL     Status: Abnormal   Collection Time   04/01/12  8:18 AM      Component Value Range Comment   Sodium 141  135 - 145 mEq/L    Potassium 3.5  3.5 - 5.1 mEq/L    Chloride 109  96 - 112 mEq/L    CO2 22  19 - 32 mEq/L    Glucose, Bld 138 (*) 70 - 99 mg/dL    BUN 12  6 - 23 mg/dL    Creatinine, Ser 1.61  0.50 - 1.35 mg/dL    Calcium 9.0  8.4 - 09.6 mg/dL    Total Protein 6.6  6.0 - 8.3 g/dL    Albumin 2.9 (*) 3.5 - 5.2 g/dL    AST 32  0 - 37 U/L    ALT 25  0 - 53 U/L    Alkaline Phosphatase 96  39 - 117 U/L    Total Bilirubin 0.5  0.3 - 1.2 mg/dL    GFR calc non Af Amer 69 (*) >90 mL/min    GFR calc Af Amer 80 (*) >90 mL/min   PRO B NATRIURETIC PEPTIDE     Status:  Abnormal   Collection Time   04/01/12  8:18 AM      Component Value Range Comment   Pro B Natriuretic peptide (BNP) 8916.0 (*) 0 - 125 pg/mL   TROPONIN I     Status: Normal   Collection Time   04/01/12  8:18 AM      Component Value Range Comment   Troponin I <0.30  <0.30 ng/mL   RETICULOCYTES     Status: Abnormal   Collection Time   04/01/12  8:18 AM      Component Value Range Comment   Retic Ct Pct 3.0  0.4 - 3.1 %    RBC. 3.36 (*) 4.22 - 5.81 MIL/uL    Retic Count, Manual 100.8  19.0 - 186.0 K/uL     Dg Chest Portable 1 View  04/01/2012  *RADIOLOGY REPORT*  Clinical Data: Shortness of breath, history hypertension, carcinoma of the colon  PORTABLE CHEST - 1 VIEW  Comparison: Portable exam 0937 hours without priors for comparison. Correlation:  CT chest 07/12/2003  Findings: Enlargement of cardiac silhouette. Tortuous aorta with atherosclerotic calcification. Bilateral perihilar to basilar infiltrates question edema versus infection. No pleural effusion or pneumothorax. Bones unremarkable.  IMPRESSION: Enlargement of cardiac silhouette. Bilateral perihilar to basilar infiltrates question pulmonary edema versus infection.   Original Report Authenticated By: Ulyses Southward, M.D.     ROS See HPI Eyes: Negative Ears:Negative for hearing loss, tinnitus Cardiovascular: Negative for chest pain, palp shortness of breathitations,irregular heartbeat, near-syncope, orthopnea, paroxysmal nocturnal dyspnea and syncope,edema, claudication, cyanosis,.  Respiratory:   Negative for hemoptysis,,and wheezing.   Endocrine: Negative for cold intolerance and heat intolerance.  Hematologic/Lymphatic: Negative for adenopathy and bleeding problem. Does not bruise/bleed easily.  Musculoskeletal: Negative.   Gastrointestinal: Negative for nausea, vomiting, reflux, abdominal pain, diarrhea, constipation.   Genitourinary: Negative for bladder incontinence, dysuria, flank pain, frequency, hematuria, hesitancy, nocturia  and urgency.  Neurological: Negative.  Allergic/Immunologic: Negative for environmental allergies.  Blood pressure 179/115, pulse 110, temperature 98.6 F (37 C), resp. rate 27, height 5\' 11"  (1.803 m), weight 225 lb (102.059 kg), SpO2 95.00%. Physical  Exam PHYSICAL EXAM: Well-nournished, in no acute distress. Neck: slight increased JVD,no HJR, Bruit, or thyroid enlargement Lungs: decreased breath sounds with rales throughout Cardiovascular: RRR at 116 beats per minute, PMI not displaced, positive S4 and 2/6 systolic murmur at the left sternal border, no bruit, thrill, or heave. Abdomen: Obese, BS normal. Soft without organomegaly, masses, lesions or tenderness. Extremities: without cyanosis, clubbing or edema. Good distal pulses bilateral SKin: Warm, no lesions or rashes  Musculoskeletal: No deformities Neuro: no focal signs  EKG sinus tachycardia with PVCs old septal infarct nonspecific ST-T wave changes   Assessment/Plan: #1 acute pulmonary edema-rule out ischemia, LV dysfunction, hypertensive cardiomyopathy:continue to diurese, 2-D echo pending #2 recent upper respiratory infection #3 hypertension untreated now on Coreg 3.125 mg b.i.d. Will add ACE inhibitor. 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 Continue iv nitro for 24 hours.  Looking at echo At beside EF bout 30% with diffuse hypokinesis.  Will need right and left heart cath when pulmonary edema improved.    Charlton Haws

## 2012-04-02 ENCOUNTER — Inpatient Hospital Stay (HOSPITAL_COMMUNITY): Payer: Medicare Other

## 2012-04-02 DIAGNOSIS — E669 Obesity, unspecified: Secondary | ICD-10-CM

## 2012-04-02 DIAGNOSIS — R0602 Shortness of breath: Secondary | ICD-10-CM

## 2012-04-02 LAB — BASIC METABOLIC PANEL
BUN: 17 mg/dL (ref 6–23)
Calcium: 9.2 mg/dL (ref 8.4–10.5)
GFR calc Af Amer: 74 mL/min — ABNORMAL LOW (ref >90)
Glucose, Bld: 131 mg/dL — ABNORMAL HIGH (ref 70–99)
Potassium: 3.5 mEq/L (ref 3.5–5.1)

## 2012-04-02 LAB — LIPID PANEL
Cholesterol: 217 mg/dL — ABNORMAL HIGH (ref 0–200)
Total CHOL/HDL Ratio: 2.1 RATIO
Triglycerides: 57 mg/dL (ref <150)
VLDL: 11 mg/dL (ref 0–40)

## 2012-04-02 LAB — TROPONIN I: Troponin I: <0.3 ng/mL (ref <0.30)

## 2012-04-02 MED ORDER — FUROSEMIDE 10 MG/ML IJ SOLN
60.0000 mg | Freq: Once | INTRAMUSCULAR | Status: AC
  Administered 2012-04-02: 60 mg via INTRAVENOUS
  Filled 2012-04-02: qty 6

## 2012-04-02 MED ORDER — FUROSEMIDE 40 MG PO TABS: 40.0000 mg | ORAL_TABLET | Freq: Every day | ORAL | Status: DC

## 2012-04-02 MED ORDER — FUROSEMIDE 80 MG PO TABS
80.0000 mg | ORAL_TABLET | Freq: Two times a day (BID) | ORAL | Status: DC
  Administered 2012-04-02: 80 mg via ORAL
  Filled 2012-04-02: qty 1

## 2012-04-02 MED ORDER — LISINOPRIL 5 MG PO TABS
5.0000 mg | ORAL_TABLET | Freq: Two times a day (BID) | ORAL | Status: DC
  Administered 2012-04-02 – 2012-04-03 (×3): 5 mg via ORAL
  Filled 2012-04-02 (×4): qty 1
  Filled 2012-04-02: qty 2

## 2012-04-02 MED ORDER — POTASSIUM CHLORIDE CRYS ER 20 MEQ PO TBCR
40.0000 meq | EXTENDED_RELEASE_TABLET | Freq: Once | ORAL | Status: AC
  Administered 2012-04-02: 40 meq via ORAL
  Filled 2012-04-02: qty 2

## 2012-04-02 MED ORDER — CARVEDILOL 6.25 MG PO TABS
6.2500 mg | ORAL_TABLET | Freq: Two times a day (BID) | ORAL | Status: DC
  Administered 2012-04-02 – 2012-04-03 (×2): 6.25 mg via ORAL
  Filled 2012-04-02 (×2): qty 1
  Filled 2012-04-02 (×2): qty 2

## 2012-04-02 NOTE — Progress Notes (Addendum)
     Subjective: Patient feels much improved since he was admitted yesterday. His breathing is virtually normal. He denies any chest pain. Serial cardiac enzymes are negative. We do not have a full echocardiogram report as of yet. He has been on nitroglycerin drip overnight. He has had a very good urine output.           Physical Exam: Blood pressure 129/93, pulse 40, temperature 97.9 F (36.6 C), temperature source Oral, resp. rate 27, height 5\' 11"  (1.803 m), weight 97.8 kg (215 lb 9.8 oz), SpO2 99.00%. He looks systemically well. He is obese. Lung fields are entirely clear. Heart sounds are present with a sinus tachycardia, possibly gallop rhythm. Jugular venous pressure is not elevated. There is no peripheral pitting edema. He is alert and orientated.   Investigations:  Recent Results (from the past 240 hour(s))  MRSA PCR SCREENING     Status: Normal   Collection Time   04/01/12  2:23 PM      Component Value Range Status Comment   MRSA by PCR NEGATIVE  NEGATIVE Final      Basic Metabolic Panel:  Basename 04/02/12 0436 04/01/12 0818  NA 140 141  K 3.5 3.5  CL 104 109  CO2 24 22  GLUCOSE 131* 138*  BUN 17 12  CREATININE 1.11 1.04  CALCIUM 9.2 9.0  MG -- --  PHOS -- --   Liver Function Tests:  [facility] 04/01/12 0818  AST 32  ALT 25  ALKPHOS 96  BILITOT 0.5  PROT 6.6  ALBUMIN 2.9*     CBC:  Basename 04/01/12 0818  WBC 7.4  NEUTROABS 5.7  HGB 11.6*  HCT 34.5*  MCV 102.1*  PLT 222    Dg Chest Portable 1 View  04/01/2012  *RADIOLOGY REPORT*  Clinical Data: Shortness of breath, history hypertension, carcinoma of the colon  PORTABLE CHEST - 1 VIEW  Comparison: Portable exam 0937 hours without priors for comparison. Correlation:  CT chest 07/12/2003  Findings: Enlargement of cardiac silhouette. Tortuous aorta with atherosclerotic calcification. Bilateral perihilar to basilar infiltrates question edema versus infection. No pleural effusion or pneumothorax.  Bones unremarkable.  IMPRESSION: Enlargement of cardiac silhouette. Bilateral perihilar to basilar infiltrates question pulmonary edema versus infection.   Original Report Authenticated By: Ulyses Southward, M.D.       Medications: I have reviewed the patient's current medications.  Impression: 1. Acute probably systolic congestive heart failure, improving with net  output of  5 L. Initial review by cardiology yesterday at the bedside suggested an ejection fraction of around 30%.  2. Hypertension, improved. 3. Obesity. 4. Macrocytic anemia-B12 and folate levels are normal, TSH is not elevated.     Plan: 1. Discontinue nitroglycerin drip.  2. Patient will benefit from maximizing ACE inhibitor dose. Increase lisinopril to 5 mg twice a day. 3. Continue with low dose Coreg. 4. Change intravenous Lasix to oral Lasix. 5. Await full echocardiogram report. 6. As suggested by cardiology, he will benefit from cardiac catheterization. We will await further recommendations from cardiology.     LOS: 1 day   Wilson Singer Pager [phone]  04/02/2012, 7:20 AM

## 2012-04-02 NOTE — Clinical Documentation Improvement (Signed)
Hypertension Documentation Clarification Query  THIS DOCUMENT IS NOT A PERMANENT PART OF THE MEDICAL RECORD  TO RESPOND TO THE THIS QUERY, FOLLOW THE INSTRUCTIONS BELOW:  1. If needed, update documentation for the patient's encounter via the notes activity.  2. Access this query again and click edit on the In [employer].  3. After updating, or not, click F2 to complete all highlighted (required) fields concerning your review. Select "additional documentation in the medical record" OR "no additional documentation provided".  4. Click Sign note button.  5. The deficiency will fall out of your In Basket *Please let us know if you are not able to complete this workflow by phone or e-mail (listed below).        04/02/12  Dear Dr.  Karilyn Cota Marton Redwood  In an effort to better capture your patient's severity of illness, reflect appropriate length of stay and utilization of resources, a review of the patient medical record has revealed the following indicators.  Based on your clinical judgment, please clarify and document in a progress note and/or discharge summary the clinical condition associated with the following supporting information.  In responding to this query please exercise your independent judgment.  The fact that a query is asked, does not imply that any particular answer is desired or expected.    Possible Clinical Conditions?    " Accelerated Hypertension  " Malignant Hypertension  " Or Other Condition __________________________  " Cannot Clinically Determine   Clinical Information:  Risk Factors: History of Hypertension Race-Black Gender-Male  Signs & Symptoms: Blood pressures On arrival in ED=180/117 12/4=[id number]/119-76  Diagnostics: Echo: pending  Treatment: Coreg 6.25mg  bid Lisinopril 5mg  bid Drips: Nitroglyerin 60mcg/min  You may use possible, probable, or suspect with inpatient documentation. Possible, probable, suspected diagnoses  MUST be documented at the time of discharge.  Reviewed:  no additional documentation provided  Thank You,  Harless Litten RN, MSN Clinical Documentation Specialist: Office# [phone] [facility] Health Information Management [place]

## 2012-04-02 NOTE — Progress Notes (Signed)
Pt having loose stools. Dr. Karilyn Cota made aware and new order received for C.diff. Will collect.

## 2012-04-02 NOTE — Progress Notes (Signed)
Nutrition Education Note  RD consulted for nutrition education/ reinforcement regarding Low sodium diet and fluid restriction of 1800 ml/day.  RD provided "Low Sodium Nutrition Therapy" handout from the Academy of Nutrition and Dietetics. Reviewed patient's dietary recall. Provided examples on ways to decrease sodium intake in diet. Discouraged intake of processed foods and use of salt shaker. Encouraged fresh fruits and vegetables as well as whole grain sources of carbohydrates to maximize fiber intake.   RD discussed why it is important for patient to adhere to diet recommendations, and emphasized the role of fluids, foods to avoid, and importance of weighing self daily. Teach back method used.  Expect  good compliance.  Body mass index is 30.07 kg/(m^2). Pt meets criteria for Obesity Class I based on current BMI.  Current diet order is Heart Healthy, patient is consuming approximately n/a % of meals at this time. Labs and medications reviewed. No further nutrition interventions warranted at this time. RD contact information provided. If additional nutrition issues arise, please re-consult RD.   [phone]

## 2012-04-02 NOTE — Progress Notes (Signed)
Subjective: Patient denies SOB with sitting up  Still wheezes with lying down.   Objective: Filed Vitals:   04/02/12 0500 04/02/12 0600 04/02/12 0734 04/02/12 0800  BP: 154/76 129/93  159/77  Pulse: 86 40 93 77  Temp:    97.5 F (36.4 C)  TempSrc:    Oral  Resp: 20 27  20   Height:      Weight: 215 lb 9.8 oz (97.8 kg)     SpO2: 99% 99%  94%   Weight change:   Intake/Output Summary (Last 24 hours) at 04/02/12 0914 Last data filed at 04/02/12 0743  Gross per 24 hour  Intake 1016.08 ml  Output   6235 ml  Net -5218.92 ml    General: Alert, awake, oriented x3, in no acute distress Neck  No signif JVD Heart: Regular rate and rhythm, without murmurs, rubs, gallops.  Lungs: Clear to auscultation. Very mild wheeze on forced expiration. Exemities:  No edema.   Neuro: Grossly intact, nonfocal.  Tele:  SR with PVCs (frequent) Lab Results: Results for orders placed during the hospital encounter of 04/01/12 (from the past 24 hour(s))  TROPONIN I     Status: Normal   Collection Time   04/01/12 11:34 AM      Component Value Range   Troponin I <0.30  <0.30 ng/mL  TSH     Status: Normal   Collection Time   04/01/12 11:34 AM      Component Value Range   TSH 0.703  0.350 - 4.500 uIU/mL  VITAMIN B12     Status: Normal   Collection Time   04/01/12 11:34 AM      Component Value Range   Vitamin B-12 464  211 - 911 pg/mL  FOLATE     Status: Normal   Collection Time   04/01/12 11:34 AM      Component Value Range   Folate >20.0    IRON AND TIBC     Status: Abnormal   Collection Time   04/01/12 11:34 AM      Component Value Range   Iron 46  42 - 135 ug/dL   TIBC 956  213 - 086 ug/dL   Saturation Ratios 17 (*) 20 - 55 %   UIBC 228  125 - 400 ug/dL  FERRITIN     Status: Abnormal   Collection Time   04/01/12 11:34 AM      Component Value Range   Ferritin 364 (*) 22 - 322 ng/mL  HEMOGLOBIN A1C     Status: Normal   Collection Time   04/01/12 11:34 AM      Component Value Range   Hemoglobin A1C 5.5  <5.7 %   Mean Plasma Glucose 111  <117 mg/dL  MRSA PCR SCREENING     Status: Normal   Collection Time   04/01/12  2:23 PM      Component Value Range   MRSA by PCR NEGATIVE  NEGATIVE  TROPONIN I     Status: Normal   Collection Time   04/01/12  4:16 PM      Component Value Range   Troponin I <0.30  <0.30 ng/mL  TROPONIN I     Status: Normal   Collection Time   04/01/12 11:59 PM      Component Value Range   Troponin I <0.30  <0.30 ng/mL  BASIC METABOLIC PANEL     Status: Abnormal   Collection Time   04/02/12  4:36 AM  Component Value Range   Sodium 140  135 - 145 mEq/L   Potassium 3.5  3.5 - 5.1 mEq/L   Chloride 104  96 - 112 mEq/L   CO2 24  19 - 32 mEq/L   Glucose, Bld 131 (*) 70 - 99 mg/dL   BUN 17  6 - 23 mg/dL   Creatinine, Ser 2.95  0.50 - 1.35 mg/dL   Calcium 9.2  8.4 - 62.1 mg/dL   GFR calc non Af Amer 64 (*) >90 mL/min   GFR calc Af Amer 74 (*) >90 mL/min  LIPID PANEL     Status: Abnormal   Collection Time   04/02/12  4:36 AM      Component Value Range   Cholesterol 217 (*) 0 - 200 mg/dL   Triglycerides 57  <308 mg/dL   HDL 657  >84 mg/dL   Total CHOL/HDL Ratio 2.1     VLDL 11  0 - 40 mg/dL   LDL Cholesterol 696 (*) 0 - 99 mg/dL    Studies/Results: Echo done this AM.    Medications: Reviewed   Patient Active Hospital Problem List: CHF (congestive heart failure) (04/01/2012)   Assessment: Diuresing  I still think he has increased volume on exam.  Would continue  Watch K and replete as needed.  Will increase Coreg with frequent ectopy  May improve.  Increase forward output. I would give one more dose of IV lasix and then switch to PO   Was not on any at home.  Would start with 40 1x per day.  Will have dietary see to reinforce Na.  HTN (hypertension) (04/01/2012)   Assessment: Increase coreg.  Continue ACEI   *   LOS: 1 day   Dietrich Pates 04/02/2012, 9:14 AM

## 2012-04-03 ENCOUNTER — Encounter (HOSPITAL_COMMUNITY)
Admission: EM | Disposition: A | Discharge status: Home Health | Payer: Self-pay | Source: Home / Self Care | Attending: Internal Medicine

## 2012-04-03 DIAGNOSIS — I2510 Atherosclerotic heart disease of native coronary artery without angina pectoris: Secondary | ICD-10-CM

## 2012-04-03 HISTORY — PX: RIGHT HEART CATHETERIZATION: SHX5447

## 2012-04-03 HISTORY — PX: PERCUTANEOUS CORONARY INTERVENTION-BALLOON ONLY: SHX6014

## 2012-04-03 HISTORY — PX: CORONARY ANGIOGRAM: SHX5786

## 2012-04-03 LAB — POCT I-STAT 3, VENOUS BLOOD GAS (G3P V)
Acid-Base Excess: 1 mmol/L (ref 0.0–2.0)
O2 Saturation: 59 %
TCO2: 28 mmol/L (ref 0–100)

## 2012-04-03 LAB — BASIC METABOLIC PANEL
BUN: 23 mg/dL (ref 6–23)
CO2: 27 mEq/L (ref 19–32)
Glucose, Bld: 101 mg/dL — ABNORMAL HIGH (ref 70–99)
Potassium: 3.6 mEq/L (ref 3.5–5.1)
Sodium: 140 mEq/L (ref 135–145)

## 2012-04-03 LAB — POCT I-STAT 3, ART BLOOD GAS (G3+)
Acid-Base Excess: 1 mmol/L (ref 0.0–2.0)
Bicarbonate: 25.9 mEq/L — ABNORMAL HIGH (ref 20.0–24.0)
O2 Saturation: 96 %
TCO2: 27 mmol/L (ref 0–100)
pCO2 arterial: 41.9 mmHg (ref 35.0–45.0)
pO2, Arterial: 83 mmHg (ref 80.0–100.0)

## 2012-04-03 LAB — CBC
Hemoglobin: 11.3 g/dL — ABNORMAL LOW (ref 13.0–17.0)
MCH: 34.3 pg — ABNORMAL HIGH (ref 26.0–34.0)
MCHC: 33.5 g/dL (ref 30.0–36.0)
MCV: 102.4 fL — ABNORMAL HIGH (ref 78.0–100.0)
RBC: 3.29 MIL/uL — ABNORMAL LOW (ref 4.22–5.81)

## 2012-04-03 LAB — POCT ACTIVATED CLOTTING TIME: Activated Clotting Time: 319 seconds

## 2012-04-03 SURGERY — CORONARY ANGIOGRAM

## 2012-04-03 MED ORDER — HEPARIN (PORCINE) IN NACL 2-0.9 UNIT/ML-% IJ SOLN
INTRAMUSCULAR | Status: AC
  Filled 2012-04-03: qty 1500

## 2012-04-03 MED ORDER — LISINOPRIL 5 MG PO TABS
5.0000 mg | ORAL_TABLET | Freq: Two times a day (BID) | ORAL | Status: DC
  Administered 2012-04-03 – 2012-04-04 (×2): 5 mg via ORAL
  Filled 2012-04-03 (×4): qty 1

## 2012-04-03 MED ORDER — SODIUM CHLORIDE 0.9 % IV SOLN
1.0000 mL/kg/h | INTRAVENOUS | Status: AC
  Administered 2012-04-03: 18:00:00 1 mL/kg/h via INTRAVENOUS

## 2012-04-03 MED ORDER — NITROGLYCERIN 0.2 MG/ML ON CALL CATH LAB
INTRAVENOUS | Status: AC
  Filled 2012-04-03: qty 1

## 2012-04-03 MED ORDER — ASPIRIN 81 MG PO CHEW
81.0000 mg | CHEWABLE_TABLET | Freq: Every day | ORAL | Status: DC
  Filled 2012-04-03: qty 1

## 2012-04-03 MED ORDER — SPIRONOLACTONE 25 MG PO TABS: 12.5000 mg | ORAL_TABLET | Freq: Every day | ORAL | Status: DC

## 2012-04-03 MED ORDER — FAMOTIDINE IN NACL 20-0.9 MG/50ML-% IV SOLN
INTRAVENOUS | Status: AC
  Filled 2012-04-03: qty 50

## 2012-04-03 MED ORDER — HEPARIN SODIUM (PORCINE) 1000 UNIT/ML IJ SOLN
INTRAMUSCULAR | Status: AC
  Filled 2012-04-03: qty 1

## 2012-04-03 MED ORDER — CARVEDILOL 6.25 MG PO TABS
6.2500 mg | ORAL_TABLET | Freq: Two times a day (BID) | ORAL | Status: DC
  Administered 2012-04-03 – 2012-04-04 (×2): 6.25 mg via ORAL
  Filled 2012-04-03 (×4): qty 1

## 2012-04-03 MED ORDER — HEPARIN (PORCINE) IN NACL 100-0.45 UNIT/ML-% IJ SOLN
1200.0000 [IU]/h | INTRAMUSCULAR | Status: DC
  Administered 2012-04-03: 1200 [IU]/h via INTRAVENOUS
  Filled 2012-04-03 (×2): qty 250

## 2012-04-03 MED ORDER — ISOSORBIDE MONONITRATE 15 MG HALF TABLET
15.0000 mg | ORAL_TABLET | Freq: Every day | ORAL | Status: DC
  Administered 2012-04-03: 15 mg via ORAL
  Filled 2012-04-03 (×2): qty 1

## 2012-04-03 MED ORDER — HEPARIN (PORCINE) IN NACL 2-0.9 UNIT/ML-% IJ SOLN
INTRAMUSCULAR | Status: AC
  Filled 2012-04-03: qty 1000

## 2012-04-03 MED ORDER — LIDOCAINE HCL (PF) 1 % IJ SOLN
INTRAMUSCULAR | Status: AC
  Filled 2012-04-03: qty 30

## 2012-04-03 MED ORDER — FUROSEMIDE 40 MG PO TABS
40.0000 mg | ORAL_TABLET | Freq: Two times a day (BID) | ORAL | Status: DC
Start: 2012-04-03 — End: 2012-04-03

## 2012-04-03 MED ORDER — CLOPIDOGREL BISULFATE 300 MG PO TABS
ORAL_TABLET | ORAL | Status: AC
  Filled 2012-04-03: qty 2

## 2012-04-03 MED ORDER — BIVALIRUDIN 250 MG IV SOLR
INTRAVENOUS | Status: AC
  Filled 2012-04-03: qty 250

## 2012-04-03 MED ORDER — HYDRALAZINE HCL 20 MG/ML IJ SOLN
10.0000 mg | Freq: Once | INTRAMUSCULAR | Status: AC
  Administered 2012-04-03 (×2): 10 mg via INTRAVENOUS
  Filled 2012-04-03: qty 0.5

## 2012-04-03 NOTE — CV Procedure (Signed)
   CARDIAC CATH NOTE  Name: Carlos Joseph MRN: [id number] DOB: 1940-10-20  Procedure: PTCA and stenting of the PDA  Indication: 71 yo BM who presented with acute systolic CHF. 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  Procedural Details: The right groin was prepped, draped, and anesthetized with 1% lidocaine. The diagnostic sheaths in the right femoral artery were exchanged for a 6 french arterial sheath.  Weight-based bivalirudin was given for anticoagulation. Plavix 600 mg was given po. Once a therapeutic ACT was achieved, a 6 [place] FR4 guide catheter was inserted.  A prowater coronary guidewire was used to cross the lesion.  The lesion was dilated with a 2.0 balloon. The lesion did not yield to this.  The lesion was then dilated with a 2.25 mm noncompliant balloon.  Following PCI, there was <10% residual stenosis and TIMI-3 flow. Given the small vessel caliber we did not place a stent. Final angiography confirmed an excellent result. The patient tolerated the procedure well. There were no immediate procedural complications. Femoral hemostasis was achieved with manual compression. The patient was transferred to the post catheterization recovery area for further monitoring.  Lesion Data: Vessel: PDA Percent stenosis (pre): 90% TIMI-flow (pre):  3 POBA Percent stenosis (post): <10% TIMI-flow (post): 3  Conclusions: Successful POBA of the PDA  Recommendations: ASA only. Medical Rx for CHF.  Theron Arista [facility] 04/03/2012, 5:42 PM

## 2012-04-03 NOTE — Interval H&P Note (Signed)
History and Physical Interval Note:  04/03/2012 1:59 PM  Carlos Joseph  has presented today for surgery, with the diagnosis of cp  The various methods of treatment have been discussed with the patient and family. After consideration of risks, benefits and other options for treatment, the patient has consented to  Procedure(s) (LRB) with comments: LEFT AND RIGHT HEART CATHETERIZATION WITH CORONARY ANGIOGRAM () as a surgical intervention .  The patient's history has been reviewed, patient examined, no change in status, stable for surgery.  I have reviewed the patient's chart and labs.  Questions were answered to the patient's satisfaction.     Charlton Haws

## 2012-04-03 NOTE — Progress Notes (Signed)
Site area: right groin  Site Prior to Removal:  Level 0  Pressure Applied For 20 MINUTES    Minutes Beginning at 2045  Manual:   yes  Patient Status During Pull:  Tolerated well  Post Pull Groin Site:  Level 0  Post Pull Instructions Given:  yes  Post Pull Pulses Present:  yes  Strong per doppler  Dressing Applied:  yes  Comments: Bp was running 160's systolic and required 2 doses of hydralazine to get SBP<150 for sheath pull.   Large inguinal hernia extending into scrotum noted.  May make it difficult to identify hematoma if one forms.  Informed pt to notify RN if any swelling develops that is new or if any dampness, pain, warmth, or firmness develops in groin area.  Pt voices understanding.

## 2012-04-03 NOTE — Progress Notes (Signed)
Report given to [facility] RN and to [place], Charity fundraiser in [employer].

## 2012-04-03 NOTE — H&P (View-Only) (Signed)
SUBJECTIVE:Breathing about the same at present. Denies chest discomfort.  Principal Problem:  *CHF (congestive heart failure) Active Problems:  HTN (hypertension)  Obesity   LABS: Basic Metabolic Panel:  Basename 04/03/12 0456 04/02/12 0436  NA 140 140  K 3.6 3.5  CL 104 104  CO2 27 24  GLUCOSE 101* 131*  BUN 23 17  CREATININE 1.26 1.11  CALCIUM 9.4 9.2  MG -- --  PHOS -- --   Liver Function Tests:  Basename 04/01/12 0818  AST 32  ALT 25  ALKPHOS 96  BILITOT 0.5  PROT 6.6  ALBUMIN 2.9*   No results found for this basename: LIPASE:2,AMYLASE:2 in the last 72 hours CBC:  Basename 04/03/12 0456 04/01/12 0818  WBC 5.9 7.4  NEUTROABS -- 5.7  HGB 11.3* 11.6*  HCT 33.7* 34.5*  MCV 102.4* 102.1*  PLT 252 222   Cardiac Enzymes:  Basename 04/01/12 2359 04/01/12 1616 04/01/12 1134  CKTOTAL -- -- --  CKMB -- -- --  CKMBINDEX -- -- --  TROPONINI <0.30 <0.30 <0.30   Hemoglobin A1C:  Basename 04/01/12 1134  HGBA1C 5.5   Fasting Lipid Panel:  Basename 04/02/12 0436  CHOL 217*  HDL 102  LDLCALC 104*  TRIG 57  CHOLHDL 2.1  LDLDIRECT --   Thyroid Function Tests:  Basename 04/01/12 1134  TSH 0.703  T4TOTAL --  T3FREE --  THYROIDAB --    RADIOLOGY: Dg Chest 1 View  04/02/2012  *RADIOLOGY REPORT*  Clinical Data: CHF  CHEST - 1 VIEW  Comparison: Portable exam 0630 hours compared to 04/01/2012  Findings: Enlargement of cardiac silhouette with pulmonary vascular congestion. Calcified tortuous aorta. Improved perihilar infiltrates. No gross pleural effusion or pneumothorax.  IMPRESSION: Improved pulmonary infiltrates consistent with improving edema.   Original Report Authenticated By: Mark Boles, M.D.    Dg Chest Portable 1 View  04/01/2012  *RADIOLOGY REPORT*  Clinical Data: Shortness of breath, history hypertension, carcinoma of the colon  PORTABLE CHEST - 1 VIEW  Comparison: Portable exam 0937 hours without priors for comparison. Correlation:  CT chest  07/12/2003  Findings: Enlargement of cardiac silhouette. Tortuous aorta with atherosclerotic calcification. Bilateral perihilar to basilar infiltrates question edema versus infection. No pleural effusion or pneumothorax. Bones unremarkable.  IMPRESSION: Enlargement of cardiac silhouette. Bilateral perihilar to basilar infiltrates question pulmonary edema versus infection.   Original Report Authenticated By: Mark Boles, M.D.    Echocardiogram: 04/01/2012 Left ventricle: The cavity size was severely dilated. Wall thickness was increased in a pattern of mild LVH. Systolic function was severely reduced. The estimated ejection fraction was in the range of 25% to 30%. Diffuse hypokinesis. - Left atrium: The atrium was mildly dilated. - Atrial septum: No defect or patent foramen ovale was identified. - Pulmonary arteries: PA peak pressure: 49mm Hg (S).    PHYSICAL EXAM BP 155/125  Pulse 95  Temp 98 F (36.7 C) (Oral)  Resp 25  Ht 5' 11" (1.803 m)  Wt 216 lb 0.8 oz (98 kg)  BMI 30.13 kg/m2  SpO2 98%WT Loss 9 lbs since admission but wt has been labile. General: Well developed, well nourished, in no acute distress Head: Eyes PERRLA, No xanthomas.   Normal cephalic and atramatic  Lungs: Clear bilaterally to auscultation and percussion.No wheezes are noted.  Heart: Regularly irregular, S1 S2, No MRG .  Pulses are 2+ & equal.            No carotid bruit. No JVD.  No abdominal bruits. No femoral bruits. Abdomen:   Bowel sounds are positive, abdomen soft and non-tender without masses or                  Hernia's noted. Obese, non-distended. Msk:  Back normal, normal gait. Normal strength and tone for age. Extremities: No clubbing, cyanosis or edema.  DP +1 Neuro: Alert and oriented X 3. Psych:  Good affect, responds appropriately  TELEMETRY: Reviewed telemetry pt in SR with Bigeminy.   ASSESSMENT AND PLAN:  1. Acute Respiratory Failure in the setting of acute pulmonary edema:  He has diuresed  9 lbs since admission with improvement of symptoms. Uncertain of dry wt. He states his breathing is not much better, however. EF 25%-30% per this admission's echo. Consider cardiac cath for evaluation of ischemic component of systolic dysfunction.There is not prior CAD history per documentation. Will continue diuresis, I have added isosorbide mono 15 mg daily to medications.  2. Hypertension: He continues to have elevated BP readings despite diuresis. HR in the 40-70's  on carvediolol. Also on lisinopril 5mg daily. Consider adding hydralazine or nitrates for better control and kidney perfusion. Will begin with nitrates with more recommendations per Dr.Cadyn Fann.  Kathryn M. Lawrence NP [facility] 04/03/2012, 8:35 AM  Patient seen and examined  Breathing is a little better than yesterday  Still wheezes occaisonally. Neck:  JVP normal  LUngs:  CTA  Cardiac  RRR with occasional skip  No signif murmurs.  Ext  No edema   Will plan on tx to [facility] for R and L heart cath given newly diagnosed CHF  LVEF 25 to 30%  Risks/benefits explained.  Patient understands and agrees to proceed.  IVF   NPO Will hold aldactone and lasix for now.   

## 2012-04-03 NOTE — Interval H&P Note (Signed)
History and Physical Interval Note:  04/03/2012 5:07 PM  Carlos Joseph  has presented today for surgery, with the diagnosis of cp  The various methods of treatment have been discussed with the patient and family. After consideration of risks, benefits and other options for treatment, the patient has consented to  Procedure(s) (LRB) with comments: CORONARY ANGIOGRAM () RIGHT HEART CATH () as a surgical intervention .  The patient's history has been reviewed, patient examined, no change in status, stable for surgery.  I have reviewed the patient's chart and labs.  Questions were answered to the patient's satisfaction.     Theron Arista [facility]

## 2012-04-03 NOTE — Progress Notes (Signed)
SUBJECTIVE:Breathing about the same at present. Denies chest discomfort.  Principal Problem:  *CHF (congestive heart failure) Active Problems:  HTN (hypertension)  Obesity   LABS: Basic Metabolic Panel:  Basename 04/03/12 0456 04/02/12 0436  NA 140 140  K 3.6 3.5  CL 104 104  CO2 27 24  GLUCOSE 101* 131*  BUN 23 17  CREATININE 1.26 1.11  CALCIUM 9.4 9.2  MG -- --  PHOS -- --   Liver Function Tests:  [facility] 04/01/12 0818  AST 32  ALT 25  ALKPHOS 96  BILITOT 0.5  PROT 6.6  ALBUMIN 2.9*   No results found for this basename: LIPASE:2,AMYLASE:2 in the last 72 hours CBC:  Basename 04/03/12 0456 04/01/12 0818  WBC 5.9 7.4  NEUTROABS -- 5.7  HGB 11.3* 11.6*  HCT 33.7* 34.5*  MCV 102.4* 102.1*  PLT 252 222   Cardiac Enzymes:  Basename 04/01/12 2359 04/01/12 1616 04/01/12 1134  CKTOTAL -- -- --  CKMB -- -- --  CKMBINDEX -- -- --  TROPONINI <0.30 <0.30 <0.30   Hemoglobin A1C:  Basename 04/01/12 1134  HGBA1C 5.5   Fasting Lipid Panel:  Basename 04/02/12 0436  CHOL 217*  HDL 102  LDLCALC 104*  TRIG 57  CHOLHDL 2.1  LDLDIRECT --   Thyroid Function Tests:  Basename 04/01/12 1134  TSH 0.703  T4TOTAL --  T3FREE --  THYROIDAB --    RADIOLOGY: Dg Chest 1 View  04/02/2012  *RADIOLOGY REPORT*  Clinical Data: CHF  CHEST - 1 VIEW  Comparison: Portable exam 0630 hours compared to 04/01/2012  Findings: Enlargement of cardiac silhouette with pulmonary vascular congestion. Calcified tortuous aorta. Improved perihilar infiltrates. No gross pleural effusion or pneumothorax.  IMPRESSION: Improved pulmonary infiltrates consistent with improving edema.   Original Report Authenticated By: Ulyses Southward, M.D.    Dg Chest Portable 1 View  04/01/2012  *RADIOLOGY REPORT*  Clinical Data: Shortness of breath, history hypertension, carcinoma of the colon  PORTABLE CHEST - 1 VIEW  Comparison: Portable exam 0937 hours without priors for comparison. Correlation:  CT chest  07/12/2003  Findings: Enlargement of cardiac silhouette. Tortuous aorta with atherosclerotic calcification. Bilateral perihilar to basilar infiltrates question edema versus infection. No pleural effusion or pneumothorax. Bones unremarkable.  IMPRESSION: Enlargement of cardiac silhouette. Bilateral perihilar to basilar infiltrates question pulmonary edema versus infection.   Original Report Authenticated By: Ulyses Southward, M.D.    Echocardiogram: 04/01/2012 Left ventricle: The cavity size was severely dilated. Wall thickness was increased in a pattern of mild LVH. Systolic function was severely reduced. The estimated ejection fraction was in the range of 25% to 30%. Diffuse hypokinesis. - Left atrium: The atrium was mildly dilated. - Atrial septum: No defect or patent foramen ovale was identified. - Pulmonary arteries: PA peak pressure: 49mm Hg (S).    PHYSICAL EXAM BP 155/125  Pulse 95  Temp 98 F (36.7 C) (Oral)  Resp 25  Ht 5\' 11"  (1.803 m)  Wt 216 lb 0.8 oz (98 kg)  BMI 30.13 kg/m2  SpO2 98%WT Loss 9 lbs since admission but wt has been labile. General: Well developed, well nourished, in no acute distress Head: Eyes PERRLA, No xanthomas.   Normal cephalic and atramatic  Lungs: Clear bilaterally to auscultation and percussion.No wheezes are noted.  Heart: Regularly irregular, S1 S2, No MRG .  Pulses are 2+ & equal.            No carotid bruit. No JVD.  No abdominal bruits. No femoral bruits. Abdomen:  Bowel sounds are positive, abdomen soft and non-tender without masses or                  Hernia's noted. Obese, non-distended. Msk:  Back normal, normal gait. Normal strength and tone for age. Extremities: No clubbing, cyanosis or edema.  DP +1 Neuro: Alert and oriented X 3. Psych:  Good affect, responds appropriately  TELEMETRY: Reviewed telemetry pt in SR with Bigeminy.   ASSESSMENT AND PLAN:  1. Acute Respiratory Failure in the setting of acute pulmonary edema:  He has diuresed  9 lbs since admission with improvement of symptoms. Uncertain of dry wt. He states his breathing is not much better, however. EF 25%-30% per this admission's echo. Consider cardiac cath for evaluation of ischemic component of systolic dysfunction.There is not prior CAD history per documentation. Will continue diuresis, I have added isosorbide mono 15 mg daily to medications.  2. Hypertension: He continues to have elevated BP readings despite diuresis. HR in the 40-70's  on carvediolol. Also on lisinopril 5mg  daily. Consider adding hydralazine or nitrates for better control and kidney perfusion. Will begin with nitrates with more recommendations per Dr.Neville Pauls.  Bettey Mare. Lyman Bishop NP [facility] 04/03/2012, 8:35 AM  Patient seen and examined  Breathing is a little better than yesterday  Still wheezes occaisonally. Neck:  JVP normal  LUngs:  CTA  Cardiac  RRR with occasional skip  No signif murmurs.  Ext  No edema   Will plan on tx to [facility] for R and L heart cath given newly diagnosed CHF  LVEF 25 to 30%  Risks/benefits explained.  Patient understands and agrees to proceed.  IVF   NPO Will hold aldactone and lasix for now.

## 2012-04-03 NOTE — Op Note (Signed)
   Cardiac Catheterization Procedure Note  Name: Carlos Joseph MRN: [id number] DOB: 04-27-1941  Procedure: Right Heart Cath, Left Heart Cath, Selective Coronary Angiography, LV angiography  Indication:    Procedural Details: The right groin was prepped, draped, and anesthetized with 1% lidocaine. Using the modified Seldinger technique a 5 French sheath was placed in the right femoral artery and a 7 French sheath was placed in the right femoral vein. A Swan-Ganz catheter was used for the right heart catheterization. Standard protocol was followed for recording of right heart pressures and sampling of oxygen saturations. Fick cardiac output was calculated. Standard Judkins catheters were used for selective coronary angiography and left ventriculography. There were no immediate procedural complications. The patient was transferred to the post catheterization recovery area for further monitoring.  Procedural Findings: Hemodynamics RA 18 RV 48 14 PA 43 28 PCWP 25 LV . AO 142 102  Oxygen saturations: PA 59 AO 96  Cardiac Output (Fick) 5.1  Cardiac Index (Fick) 2.34   Coronary angiography: Coronary dominance: right  Left mainstem: Normal  Left anterior descending (LAD): normal   D1: small and normal  D2: small and normal  D3 small and normal  Left circumflex (LCx): 40% proximal   50-60% mid at trifurcation.     OM1: normal  OM2: normal  Right coronary artery (RCA): 30-40% distal calcified.   PDA: 90% proximal  EF:  By Echo 30%  Final Conclusions:   Reviewed films with Dr Sw[place] .  Mid circumflex at trifurcation thought more moderate.  Plan stent proximal PDA and medical Rx for CHF Recommendations:  Advance CHF Rx stent PDA  Patient will FU with Dr Dietrich Pates in [facility] 04/03/2012, 2:28 PM

## 2012-04-03 NOTE — Progress Notes (Signed)
ANTICOAGULATION CONSULT NOTE - Initial Consult  Pharmacy Consult for Heparin Indication: Anticoagulation while awaiting PCI this evening  No Known Allergies  Patient Measurements: Height: 5\' 11"  (180.3 cm) Weight: 216 lb 0.8 oz (98 kg) IBW/kg (Calculated) : 75.3  Heparin Dosing Weight: 95 kg  Vital Signs: Temp: 98 F (36.7 C) (12/06 0843) Temp src: Oral (12/06 0843) BP: 153/75 mmHg (12/06 1200) Pulse Rate: 52  (12/06 1200)  Labs:  Basename 04/03/12 1200 04/03/12 0456 04/02/12 0436 04/01/12 2359 04/01/12 1616 04/01/12 1134 04/01/12 0818  HGB -- 11.3* -- -- -- -- 11.6*  HCT -- 33.7* -- -- -- -- 34.5*  PLT -- 252 -- -- -- -- 222  APTT -- -- -- -- -- -- --  LABPROT 12.6 -- -- -- -- -- --  INR 0.95 -- -- -- -- -- --  HEPARINUNFRC -- -- -- -- -- -- --  CREATININE -- 1.26 1.11 -- -- -- 1.04  CKTOTAL -- -- -- -- -- -- --  CKMB -- -- -- -- -- -- --  TROPONINI -- -- -- <0.30 <0.30 <0.30 --    Estimated Creatinine Clearance: 62.3 ml/min (by C-G formula based on Cr of 1.26).   Medical History: Past Medical History  Diagnosis Date  . Hypertension   . Cancer   . CHF (congestive heart failure)     Assessment: 71 y.o. M transferred to [facility] from [facility] on 12/6 for R and L heart cath in the setting of newly diagnosed CHF. Now s/p cath and awaiting PCI later this evening. Sheath still in place and pharmacy consulted to resume heparin for anticoagulation while awaiting PCI. Will not bolus and will aim for lower goal given sheath. Hep weight: 95 kg. Hgb/Hct 11.3/33.7, plts wnl. Pt received Hep 5000 units SQ at 0642 this a.m  Goal of Therapy:  Heparin level 0.3-0.5 units/ml Monitor platelets by anticoagulation protocol: Yes   Plan:  1. Initiate heparin at rate of 1200 units/hr (12 ml/hr) 2. D/c heparin SQ 3. Daily heparin levels, CBC (while on heparin) 4. Will f/u with heparin level in 8 hours or post-PCI this evening  Georgina Pillion, PharmD, BCPS Clinical Pharmacist Pager:  [phone] 04/03/2012 3:44 PM

## 2012-04-04 ENCOUNTER — Encounter (HOSPITAL_COMMUNITY): Payer: Self-pay | Admitting: Physician Assistant

## 2012-04-04 DIAGNOSIS — IMO0001 Reserved for inherently not codable concepts without codable children: Secondary | ICD-10-CM

## 2012-04-04 DIAGNOSIS — I2510 Atherosclerotic heart disease of native coronary artery without angina pectoris: Secondary | ICD-10-CM | POA: Diagnosis present

## 2012-04-04 DIAGNOSIS — I5021 Acute systolic (congestive) heart failure: Principal | ICD-10-CM

## 2012-04-04 LAB — BASIC METABOLIC PANEL
CO2: 25 mEq/L (ref 19–32)
Chloride: 106 mEq/L (ref 96–112)
Glucose, Bld: 105 mg/dL — ABNORMAL HIGH (ref 70–99)
Potassium: 3.4 mEq/L — ABNORMAL LOW (ref 3.5–5.1)
Sodium: 140 mEq/L (ref 135–145)

## 2012-04-04 LAB — CBC
Hemoglobin: 10.8 g/dL — ABNORMAL LOW (ref 13.0–17.0)
MCHC: 33.1 g/dL (ref 30.0–36.0)
RDW: 15.6 % — ABNORMAL HIGH (ref 11.5–15.5)

## 2012-04-04 MED ORDER — POTASSIUM CHLORIDE CRYS ER 20 MEQ PO TBCR: 20.0000 meq | EXTENDED_RELEASE_TABLET | Freq: Every day | ORAL | Status: DC

## 2012-04-04 MED ORDER — ATORVASTATIN CALCIUM 40 MG PO TABS: 40.0000 mg | ORAL_TABLET | Freq: Every day | ORAL | Status: DC

## 2012-04-04 MED ORDER — CARVEDILOL 6.25 MG PO TABS: 6.2500 mg | ORAL_TABLET | Freq: Two times a day (BID) | ORAL | Status: DC

## 2012-04-04 MED ORDER — ASPIRIN 81 MG PO TBEC: 81.0000 mg | DELAYED_RELEASE_TABLET | Freq: Every day | ORAL | Status: AC

## 2012-04-04 MED ORDER — LISINOPRIL 5 MG PO TABS: 5.0000 mg | ORAL_TABLET | Freq: Two times a day (BID) | ORAL | Status: DC

## 2012-04-04 MED ORDER — ISOSORBIDE MONONITRATE ER 30 MG PO TB24: 15.0000 mg | ORAL_TABLET | Freq: Every day | ORAL | Status: DC

## 2012-04-04 MED ORDER — POTASSIUM CHLORIDE CRYS ER 20 MEQ PO TBCR
40.0000 meq | EXTENDED_RELEASE_TABLET | Freq: Once | ORAL | Status: AC
  Administered 2012-04-04: 08:00:00 40 meq via ORAL
  Filled 2012-04-04: qty 2

## 2012-04-04 MED ORDER — FUROSEMIDE 40 MG PO TABS
40.0000 mg | ORAL_TABLET | Freq: Every day | ORAL | Status: DC
  Filled 2012-04-04: qty 1

## 2012-04-04 MED ORDER — NITROGLYCERIN 0.4 MG SL SUBL: 0.4000 mg | SUBLINGUAL_TABLET | SUBLINGUAL | Status: AC | PRN

## 2012-04-04 MED ORDER — FUROSEMIDE 40 MG PO TABS: 40.0000 mg | ORAL_TABLET | Freq: Every day | ORAL | Status: DC

## 2012-04-04 NOTE — Progress Notes (Signed)
CARDIAC REHAB PHASE I   PRE:  Rate/Rhythm: 93  BP:  Supine: 150/83     SaO2: 94 RA  MODE:  Ambulation: 600 ft   POST:  Rate/Rhythem: 118 bigeminy   BP:  Sitting: 160/99     SaO2: 98 RA  Order received and appreciated.  Chart reviewed.  Pt ambulated 600 ft with assist x1.  Pt tolerated walk well, only c/o left knee pain which he stated was chronic.  Pt returned to bedside after walk with call bell in reach for breakfast and education review.  Discussed diet, exercise, stent/Plavix, CP/NTG use, and when to call MD/911.  Also reviewed CHF packet and importance of watching salt and checking weights daily.  Discussed Cardiac Rehab Phase II with pt and he requested to have info sent to [place], [place].  Pt voiced understanding. Fabio Pierce, MA, ACSM RCEP [phone]  Hazle Nordmann

## 2012-04-04 NOTE — Progress Notes (Signed)
TELEMETRY: Reviewed telemetry pt in NSR with frequent PVCs: Filed Vitals:   04/03/12 2130 04/03/12 2311 04/04/12 0041 04/04/12 0406  BP: 160/89 129/76  123/59  Pulse:  98  97  Temp:  97.7 F (36.5 C)  98.5 F (36.9 C)  TempSrc:  Oral  Oral  Resp: 22 20  23   Height:      Weight:   98.5 kg (217 lb 2.5 oz)   SpO2:  97%  95%    Intake/Output Summary (Last 24 hours) at 04/04/12 0731 Last data filed at 04/04/12 0406  Gross per 24 hour  Intake   1090 ml  Output    900 ml  Net    190 ml    SUBJECTIVE Patient denies any SOB or chest pain. Reports he lives at home with his wife. States he doesn't eat salt.  LABS: Basic Metabolic Panel:  Basename 04/04/12 0450 04/03/12 0456  NA 140 140  K 3.4* 3.6  CL 106 104  CO2 25 27  GLUCOSE 105* 101*  BUN 17 23  CREATININE 1.07 1.26  CALCIUM 9.1 9.4  MG -- --  PHOS -- --   Liver Function Tests:  [facility] 04/01/12 0818  AST 32  ALT 25  ALKPHOS 96  BILITOT 0.5  PROT 6.6  ALBUMIN 2.9*   No results found for this basename: LIPASE:2,AMYLASE:2 in the last 72 hours CBC:  Basename 04/04/12 0450 04/03/12 0456 04/01/12 0818  WBC 5.6 5.9 --  NEUTROABS -- -- 5.7  HGB 10.8* 11.3* --  HCT 32.6* 33.7* --  MCV 100.6* 102.4* --  PLT 236 252 --   Cardiac Enzymes:  Basename 04/01/12 2359 04/01/12 1616 04/01/12 1134  CKTOTAL -- -- --  CKMB -- -- --  CKMBINDEX -- -- --  TROPONINI <0.30 <0.30 <0.30   BNP: No components found with this basename: POCBNP:3 D-Dimer: No results found for this basename: DDIMER:2 in the last 72 hours Hemoglobin A1C:  Basename 04/01/12 1134  HGBA1C 5.5   Fasting Lipid Panel:  Basename 04/02/12 0436  CHOL 217*  HDL 102  LDLCALC 104*  TRIG 57  CHOLHDL 2.1  LDLDIRECT --   Thyroid Function Tests:  Basename 04/01/12 1134  TSH 0.703  T4TOTAL --  T3FREE --  THYROIDAB --   Anemia Panel:  Basename 04/01/12 1134 04/01/12 0818  VITAMINB12 464 --  FOLATE >20.0 --  FERRITIN 364* --  TIBC 274  --  IRON 46 --  RETICCTPCT -- 3.0    Radiology/Studies:  Dg Chest 1 View  04/02/2012  *RADIOLOGY REPORT*  Clinical Data: CHF  CHEST - 1 VIEW  Comparison: Portable exam 0630 hours compared to 04/01/2012  Findings: Enlargement of cardiac silhouette with pulmonary vascular congestion. Calcified tortuous aorta. Improved perihilar infiltrates. No gross pleural effusion or pneumothorax.  IMPRESSION: Improved pulmonary infiltrates consistent with improving edema.   Original Report Authenticated By: Ulyses Southward, M.D.    Dg Chest Portable 1 View  04/01/2012  *RADIOLOGY REPORT*  Clinical Data: Shortness of breath, history hypertension, carcinoma of the colon  PORTABLE CHEST - 1 VIEW  Comparison: Portable exam 0937 hours without priors for comparison. Correlation:  CT chest 07/12/2003  Findings: Enlargement of cardiac silhouette. Tortuous aorta with atherosclerotic calcification. Bilateral perihilar to basilar infiltrates question edema versus infection. No pleural effusion or pneumothorax. Bones unremarkable.  IMPRESSION: Enlargement of cardiac silhouette. Bilateral perihilar to basilar infiltrates question pulmonary edema versus infection.   Original Report Authenticated By: Ulyses Southward, M.D.     PHYSICAL  EXAM General: Well developed, obese, in no acute distress. Head: Normocephalic, atraumatic, sclera non-icteric, no xanthomas, nares are without discharge. Neck: Negative for carotid bruits. JVD not elevated. Lungs: Clear bilaterally to auscultation without wheezes, rales, or rhonchi. Breathing is unlabored. Heart: RRR S1 S2 without murmurs, rubs, or gallops.  Abdomen: Soft, non-tender, obese with normoactive bowel sounds. No hepatomegaly. No rebound/guarding. No obvious abdominal masses. Msk:  Strength and tone appears normal for age. Extremities: No clubbing, cyanosis or edema.  Distal pedal pulses are 2+ and equal bilaterally. No groin hematoma. Neuro: Alert and oriented X 3. Moves all extremities  spontaneously. Psych:  Responds to questions appropriately with a normal affect.  ASSESSMENT AND PLAN: 1. CHF acute systolic EF 25-30%. Diffuse hypokinesis. Degree of LV dysfunction out of proportion to CAD. ? Secondary to chronic HTN. Appears to be well compensated. Diuresed 9 labs since admission. Will DC home today on coreg, lisinopril, nitrates and lasix. Low sodium diet. F/u in [place] office.  2. CAD s/p POBA of small PDA. OK to Rx with ASA only.  3. HTN  4. Hypokalemia- replete.  5. Obesity.  Principal Problem:  *CHF (congestive heart failure) Active Problems:  HTN (hypertension)  Obesity    Signed, Peter Sw[place]iland MD,FACC 04/04/2012 7:40 AM

## 2012-04-04 NOTE — Discharge Summary (Signed)
Patient seen and examined and history reviewed. Agree with above findings and plan. See rounding note earlier today.  Theron Arista [facility] 04/04/2012 12:35 PM

## 2012-04-04 NOTE — Progress Notes (Signed)
NCM spoke to pt and states he lives at home with his wife. He does not need any DME at this time. He does want HH RN and PT. Will need to contact agency that can service him in his county. Isidoro Donning RN CCM Case Mgmt phone [phone]

## 2012-04-04 NOTE — Discharge Summary (Signed)
Discharge Summary   Patient ID: Carlos Joseph MRN: [id number], DOB/AGE: 73-Mar-1942 73 y.o. Admit date: 04/01/2012 D/C date:     04/04/2012  Primary Cardiologist: Sidney Ace - seen by Eden Emms in consult at [facility]  Primary Discharge Diagnoses:  1. Acute systolic CHF EF 25-30% - CAD noted on cath, but LV dysfunction felt out of proportion to CAD 2. Acute respiratory failure in the setting of pulmonary edema 3. Newly diagnosed CAD - s/p POBA to PDA 04/03/12, residual CAD for med rx - statin initiated so consider f/u OP labs 4. HTN 5. Hypokalemia, repleted 5. Obesity BMI 30.3  Secondary Discharge Diagnoses:  1. Colon cancer 2002  Hospital Course: [name] is a 71 y/o M with history of HTN/colon CA who presented to the ER initially at [facility] with  dyspnea and was found to be in pulmonary edema. He hadn't seen a doctor in over 10 years. He reported a "cold" 3 weeks ago with cough, sweating, and SOB. He thought he was getting better but prior to admission developed worsening DOE. He denied orthopnea, paroxysmal nocturnal dyspnea, chest pain or tightness, palpitations, dizziness, or presyncope. He was markedly hypertensive on admission up to 191/139. He was started on IV NTG. 2D echo showed EF 25-30% with diffuse hypokinesis. He was started on IV Lasix. Medications were titrated for HTN. He was transferred to [facility] for cardiac cath yesterday which demonstrated 40% proximal 50-60% mid at trifurcation of Cx, 30-40% distal calcified RCA, 90% prox PDA. He underwent POBA of the PDA lesion. His LV dysfunction was felt somewhat out of proportion to his CAD. His cardiomyopathy was felt possibly secondary to chronic HTN. Medical therapy was recommended for remaining CAD and CHF. The patient has diuresed 9 lbs and his BP is stable. He is feeling well. Dr. [place] has seen and examined him today and feels he is stable for discharge. Of note, he had mild anemia/hypokalemia noted on labs this admission. Will obtain BMET/CBC  at f/u visit (left message at scheduling department given that I do not have an appointment date yet). He will be a transition of care patient and will follow up in [place] office.   Discharge Vitals: Blood pressure 123/59, pulse 97, temperature 98.5 F (36.9 C), temperature source Oral, resp. rate 23, height 5\' 11"  (1.803 m), weight 217 lb 2.5 oz (98.5 kg), SpO2 95.00%.  Labs: Lab Results  Component Value Date   WBC 5.6 04/04/2012   HGB 10.8* 04/04/2012   HCT 32.6* 04/04/2012   MCV 100.6* 04/04/2012   PLT 236 04/04/2012     Lab 04/04/12 0450 04/01/12 0818  NA 140 --  K 3.4* --  CL 106 --  CO2 25 --  BUN 17 --  CREATININE 1.07 --  CALCIUM 9.1 --  PROT -- 6.6  BILITOT -- 0.5  ALKPHOS -- 96  ALT -- 25  AST -- 32  GLUCOSE 105* --    Basename 04/01/12 2359 04/01/12 1616 04/01/12 1134  CKTOTAL -- -- --  CKMB -- -- --  TROPONINI <0.30 <0.30 <0.30   Lab Results  Component Value Date   CHOL 217* 04/02/2012   HDL 102 04/02/2012   LDLCALC 104* 04/02/2012   TRIG 57 04/02/2012     Diagnostic Studies/Procedures   1. Cardiac catheterization this admission, please see full report and above for summary.  2. 2D Echo 04/01/12 Study Conclusions - Left ventricle: The cavity size was severely dilated. Wall thickness was increased in a pattern of mild LVH. Systolic  function was severely reduced. The estimated ejection fraction was in the range of 25% to 30%. Diffuse hypokinesis. - Left atrium: The atrium was mildly dilated. - Atrial septum: No defect or patent foramen ovale was identified. - Pulmonary arteries: PA peak pressure: 49mm Hg (S).  3. Dg Chest 1 View 04/02/2012  *RADIOLOGY REPORT*  Clinical Data: CHF  CHEST - 1 VIEW  Comparison: Portable exam 0630 hours compared to 04/01/2012  Findings: Enlargement of cardiac silhouette with pulmonary vascular congestion. Calcified tortuous aorta. Improved perihilar infiltrates. No gross pleural effusion or pneumothorax.  IMPRESSION: Improved  pulmonary infiltrates consistent with improving edema.   Original Report Authenticated By: Ulyses Southward, M.D.    4. Dg Chest Portable 1 View 04/01/2012  *RADIOLOGY REPORT*  Clinical Data: Shortness of breath, history hypertension, carcinoma of the colon  PORTABLE CHEST - 1 VIEW  Comparison: Portable exam 0937 hours without priors for comparison. Correlation:  CT chest 07/12/2003  Findings: Enlargement of cardiac silhouette. Tortuous aorta with atherosclerotic calcification. Bilateral perihilar to basilar infiltrates question edema versus infection. No pleural effusion or pneumothorax. Bones unremarkable.  IMPRESSION: Enlargement of cardiac silhouette. Bilateral perihilar to basilar infiltrates question pulmonary edema versus infection.   Original Report Authenticated By: Ulyses Southward, M.D.     Discharge Medications   Current Discharge Medication List    START taking these medications   Details  aspirin 81 MG EC tablet Take 1 tablet (81 mg total) by mouth daily.    atorvastatin (LIPITOR) 40 MG tablet Take 1 tablet (40 mg total) by mouth daily. Qty: 30 tablet, Refills: 6    carvedilol (COREG) 6.25 MG tablet Take 1 tablet (6.25 mg total) by mouth 2 (two) times daily with a meal. Qty: 60 tablet, Refills: 6    furosemide (LASIX) 40 MG tablet Take 1 tablet (40 mg total) by mouth daily. Qty: 30 tablet, Refills: 6    isosorbide mononitrate (IMDUR) 30 MG 24 hr tablet Take 0.5 tablets (15 mg total) by mouth daily. Qty: 30 tablet, Refills: 6    lisinopril (PRINIVIL,ZESTRIL) 5 MG tablet Take 1 tablet (5 mg total) by mouth 2 (two) times daily. Qty: 60 tablet, Refills: 6    nitroGLYCERIN (NITROSTAT) 0.4 MG SL tablet Place 1 tablet (0.4 mg total) under the tongue every 5 (five) minutes as needed for chest pain (up to 3 doses). Qty: 25 tablet, Refills: 4    potassium chloride SA (K-DUR,KLOR-CON) 20 MEQ tablet Take 1 tablet (20 mEq total) by mouth daily. Qty: 30 tablet, Refills: 6      CONTINUE these  medications which have NOT CHANGED   Details  fish oil-omega-3 fatty acids 1000 MG capsule Take 2 g by mouth daily.    Multiple Vitamin (MULTIVITAMIN WITH MINERALS) TABS Take 1 tablet by mouth daily.      STOP taking these medications     Pseudoeph-Doxylamine-DM-APAP (DAYQUIL/NYQUIL COLD/FLU RELIEF PO) Comments:  Reason for Stopping:          Disposition   The patient will be discharged in stable condition to home. Discharge Orders    Future Orders Please Complete By Expires   Diet - low sodium heart healthy      Increase activity slowly      Comments:   No driving for 2 days. No lifting over 5 lbs for 1 week. No sexual activity for 1 week. Keep procedure site clean & dry. If you notice increased pain, swelling, bleeding or pus, call/return!  You may shower, but no soaking baths/hot  tubs/pools for 1 week.   Discharge instructions      Comments:   Your heart ultrasound showed weakness of the heart muscle this admission. This may make you more susceptible to weight gain from fluid retention, which can lead to symptoms that we call heart failure. For patients with this condition, we give them these special instructions:  1. Follow a low-salt diet and watch your fluid intake. In general, you should not be taking in more than 2 liters of fluid per day. Some patients are restricted to less than 1.5 liters. This includes sources of water in foods like soup. 2. Weigh yourself on the same scale at same time of day and keep a log. 3. Call your doctor: (Anytime you feel any of the following symptoms)  - 3-4 pound weight gain in 1-2 days or 2 pounds overnight  - Shortness of breath, with or without a dry hacking cough  - Swelling in the hands, feet or stomach  - If you have to sleep on extra pillows at night in order to breathe     Follow-up Information    Follow up with [facility]. (Our office will call you to schedule appointment)    Contact information:   Retail buyer (at  [facility]) [address] [place] [address] [phone]           Duration of Discharge Encounter: Greater than 30 minutes including physician and PA time.  Signed, Ronie Spies PA-C 04/04/2012, 8:31 AM

## 2012-04-05 NOTE — Progress Notes (Signed)
   CARE MANAGEMENT NOTE 04/05/2012  Patient:  Carlos Joseph, Carlos Joseph   Account Number:  [id number]  Date Initiated:  04/05/2012  Documentation initiated by:  [facility]  Subjective/Objective Assessment:     Action/Plan:   Anticipated DC Date:  04/04/2012   Anticipated DC Plan:  HOME W HOME HEALTH SERVICES      DC Planning Services  CM consult      [facility] Choice  HOME HEALTH   Choice offered to / List presented to:  C-1 Patient        HH arranged  HH-2 PT      [facility] agency  [facility]   Status of service:  Completed, signed off Medicare Important Message given?   (If response is "NO", the following Medicare IM given date fields will be blank) Date Medicare IM given:   Date Additional Medicare IM given:    Discharge Disposition:  HOME W HOME HEALTH SERVICES  Per UR Regulation:    If discussed at Long Length of Stay Meetings, dates discussed:    Comments:  04/05/2012 1550 NCM faxed referral to [facility] for [facility] PT. They did receive and will follow up with pt. NCM contacted pt to make aware that [facility] will follow up with him. Provided pt's with contact info for [facility]. Pt states he does not feel HH is needed. NCM explained to make agency aware when they call him tomorrow. Isidoro Donning RN CM Case Mgmt phone [phone]  04/04/2012 1045 NCM spoke to pt and offered choice for [facility]. States he prefers agency that would accept his insurance coverage in his area. NCM will follow up with agencies that will accept his plan and give him a call with info. Isidoro Donning RN CCM Case Mgmt phone [phone]

## 2012-04-06 ENCOUNTER — Other Ambulatory Visit: Payer: Self-pay | Admitting: *Deleted

## 2012-04-06 DIAGNOSIS — I5021 Acute systolic (congestive) heart failure: Secondary | ICD-10-CM

## 2012-04-06 DIAGNOSIS — D649 Anemia, unspecified: Secondary | ICD-10-CM

## 2012-04-06 MED FILL — Dextrose Inj 5%: INTRAVENOUS | Qty: 50 | Status: AC

## 2012-04-10 ENCOUNTER — Encounter: Payer: Self-pay | Admitting: Adult Health

## 2012-04-10 ENCOUNTER — Ambulatory Visit (INDEPENDENT_AMBULATORY_CARE_PROVIDER_SITE_OTHER): Payer: Medicare Other | Admitting: Adult Health

## 2012-04-10 VITALS — BP 142/82 | HR 86 | Ht 71.0 in | Wt 213.1 lb

## 2012-04-10 DIAGNOSIS — I5022 Chronic systolic (congestive) heart failure: Secondary | ICD-10-CM

## 2012-04-10 DIAGNOSIS — IMO0002 Reserved for concepts with insufficient information to code with codable children: Secondary | ICD-10-CM

## 2012-04-10 DIAGNOSIS — I2510 Atherosclerotic heart disease of native coronary artery without angina pectoris: Secondary | ICD-10-CM

## 2012-04-10 DIAGNOSIS — I5021 Acute systolic (congestive) heart failure: Secondary | ICD-10-CM

## 2012-04-10 DIAGNOSIS — I10 Essential (primary) hypertension: Secondary | ICD-10-CM

## 2012-04-10 DIAGNOSIS — I240 Acute coronary thrombosis not resulting in myocardial infarction: Secondary | ICD-10-CM

## 2012-04-10 DIAGNOSIS — I509 Heart failure, unspecified: Secondary | ICD-10-CM

## 2012-04-10 DIAGNOSIS — IMO0001 Reserved for inherently not codable concepts without codable children: Secondary | ICD-10-CM

## 2012-04-10 LAB — CBC
HCT: 38.2 % — ABNORMAL LOW (ref 39.0–52.0)
MCV: 99.2 fL (ref 78.0–100.0)
RBC: 3.85 MIL/uL — ABNORMAL LOW (ref 4.22–5.81)
WBC: 5.4 10*3/uL (ref 4.0–10.5)

## 2012-04-10 MED ORDER — CARVEDILOL 12.5 MG PO TABS: 12.5000 mg | ORAL_TABLET | Freq: Two times a day (BID) | ORAL | Status: DC

## 2012-04-10 NOTE — Assessment & Plan Note (Signed)
He is status post POBA. of the PDA lesion with residual CAD noted, 40% proximal and 56% mid at the trifurcation the circumflex, with 30-40% distal calcification of the right coronary artery. He will continue on aspirin with risk reduction to include Lipitor 40 mg daily. Nitroglycerin sublingual was provided on discharge.

## 2012-04-10 NOTE — Assessment & Plan Note (Signed)
The patient is without complaint today here in the office. His weight is actually down 5 pounds since being seen last. His wife is very supportive having him weigh every day and also not providing him with any salty foods or additional salt. I will increase his Coreg to 12.5 mg twice a day as his blood pressure is 142/82. He will continue on lisinopril isosorbide and Lasix 40 mg daily. Followup BMETand CBC will be drawn today. I will see him again in one month.

## 2012-04-10 NOTE — Progress Notes (Signed)
HPI: [name] is a 71 year old patient of Dr. Charlton Haws we are seeing a hospital followup where he was admitted for recurrent chest discomfort and diagnosed with with systolic CHF, echocardiogram revealing an EF of 25-30 percent. Patient had cardiac catheterization completed during hospitalization on 04/01/2012 revealing a 40% proximal and 50-60% mid at trifurcation of circumflex, 30-40% distal calcified right coronary artery, 90% proximal PDA. He will underwent POBA  of the PDA lesion. His LV dysfunction was felt somewhat out of proportion to CAD. It was felt that his cardiomyopathy was possibly secondary to chronic hypertension. Medical therapy was recommended for the remaining CAD and CHF. The patient is diuresis during hospitalization with a final discharge weight 217 pounds. He is here in followup without any further complaints. He is due for labs which have not been completed prior to this office visit.   He denies any complaints of discomfort in his chest, fluid retention, dyspnea on exertion, but he does have overall fatigue. He is avoiding salt altogether, but states he is taking about a half gallon of water a day. His wife was in attendance states that she has to get up and walk several times during the day and go pick up the mail at the end of the driveway which has not a lengthy walk.  No Known Allergies  Current Outpatient Prescriptions  Medication Sig Dispense Refill  . aspirin 81 MG EC tablet Take 1 tablet (81 mg total) by mouth daily.      Marland Kitchen atorvastatin (LIPITOR) 40 MG tablet Take 1 tablet (40 mg total) by mouth daily.  30 tablet  6  . fish oil-omega-3 fatty acids 1000 MG capsule Take 2 g by mouth daily.      . furosemide (LASIX) 40 MG tablet Take 1 tablet (40 mg total) by mouth daily.  30 tablet  6  . isosorbide mononitrate (IMDUR) 30 MG 24 hr tablet Take 0.5 tablets (15 mg total) by mouth daily.  30 tablet  6  . lisinopril (PRINIVIL,ZESTRIL) 5 MG tablet Take 1 tablet (5 mg  total) by mouth 2 (two) times daily.  60 tablet  6  . Multiple Vitamin (MULTIVITAMIN WITH MINERALS) TABS Take 1 tablet by mouth daily.      . nitroGLYCERIN (NITROSTAT) 0.4 MG SL tablet Place 1 tablet (0.4 mg total) under the tongue every 5 (five) minutes as needed for chest pain (up to 3 doses).  25 tablet  4  . potassium chloride SA (K-DUR,KLOR-CON) 20 MEQ tablet Take 1 tablet (20 mEq total) by mouth daily.  30 tablet  6  . carvedilol (COREG) 12.5 MG tablet Take 1 tablet (12.5 mg total) by mouth 2 (two) times daily.  60 tablet  3    Past Medical History  Diagnosis Date  . Hypertension   . Colon cancer   . CHF (congestive heart failure)     a. Dx 03/2012: EF 25-30% (CAD noted, but LV dyfunction out of proportion to CAD).  . Pulmonary edema     03/2012  . CAD (coronary artery disease)     a. s/p POBA to PDA 04/03/12, residual CAD for med rx.  . Obesity     Past Surgical History  Procedure Date  . Colon surgery     AOZ:HYQMVH of systems complete and found to be negative unless listed above  PHYSICAL EXAM BP 142/82  Pulse 86  Ht 5\' 11"  (1.803 m)  Wt 213 lb 0.8 oz (96.639 kg)  BMI 29.71 kg/m2  SpO2 97%  General: Well developed, well nourished, in no acute distress Head: Eyes PERRLA, No xanthomas.   Normal cephalic and atramatic  Lungs: Clear bilaterally to auscultation and percussion. Heart: HRRR S1 S2, without MRG.  Pulses are 2+ & equal.            No carotid bruit. No JVD.  No abdominal bruits. No femoral bruits. Abdomen: Bowel sounds are positive, abdomen soft and non-tender without masses or                  Hernia's noted.Obese. Msk:  Back normal, normal gait. Normal strength and tone for age. Extremities: No clubbing, cyanosis or edema.  DP +1 Neuro: Alert and oriented X 3. Psych:  Good affect, responds appropriately  EKG: NSR with LVH and repolarization abnormality. Rate of 86 bpm.  ASSESSMENT AND PLAN

## 2012-04-10 NOTE — Assessment & Plan Note (Signed)
Blood pressure is better since discharge. Discharge blood pressure was documented at 160/99, today's blood pressure 142/82. Only changes in his carvedilol to increase it to 12.5 mg twice a day. Blood pressure control is important with his systolic dysfunction, we will repeat his echocardiogram in 2-3 months to evaluate improvement on medical management.

## 2012-04-10 NOTE — Patient Instructions (Addendum)
Your physician recommends that you schedule a follow-up appointment in: ONE MONTH WITH NISHAN/KL  Your physician recommends that you return for lab work in: TODAY (BMET/CBC) SLIPS GIVEN  YOUR PHYSICIAN RECOMMENDS YOU START A LOW SALT DIET  WALK 15 MINUTES EVERY DAY  Your physician has recommended you make the following change in your medication:   1) INCREASE COREG 12.5MG  TWICE DAILY (YOU CAN TAKE 2 OF THE CURRENT 6.25MG  TABLETS TWICE DAILY UNTIL THEY RUN OUT, HOWEVER WHEN YOU PICK UP YOUR NEW PRESCRIPTION OF THE 12.5MG  TABLETS ONLY TAKE ONE TABLET TWICE DAILY)

## 2012-04-10 NOTE — Progress Notes (Deleted)
Name: Carlos Joseph    DOB: [birth year]  Age: 71 y.o.  MR#: [id number]       PCP:  No primary provider on file.      Insurance: @PAYORNAME @   CC:   No chief complaint on file.   VS BP 142/82  Pulse 86  Ht 5\' 11"  (1.803 m)  Wt 213 lb 0.8 oz (96.639 kg)  BMI 29.71 kg/m2  SpO2 97%  Weights Current Weight  04/10/12 213 lb 0.8 oz (96.639 kg)  04/04/12 217 lb 2.5 oz (98.5 kg)  04/04/12 217 lb 2.5 oz (98.5 kg)    Blood Pressure  BP Readings from Last 3 Encounters:  04/10/12 142/82  04/04/12 160/99  04/04/12 160/99     Admit date:  (Not on file) Last encounter with RMR:  Visit date not found   Allergy No Known Allergies  Current Outpatient Prescriptions  Medication Sig Dispense Refill  . aspirin 81 MG EC tablet Take 1 tablet (81 mg total) by mouth daily.      Marland Kitchen atorvastatin (LIPITOR) 40 MG tablet Take 1 tablet (40 mg total) by mouth daily.  30 tablet  6  . carvedilol (COREG) 6.25 MG tablet Take 1 tablet (6.25 mg total) by mouth 2 (two) times daily with a meal.  60 tablet  6  . fish oil-omega-3 fatty acids 1000 MG capsule Take 2 g by mouth daily.      . furosemide (LASIX) 40 MG tablet Take 1 tablet (40 mg total) by mouth daily.  30 tablet  6  . isosorbide mononitrate (IMDUR) 30 MG 24 hr tablet Take 0.5 tablets (15 mg total) by mouth daily.  30 tablet  6  . lisinopril (PRINIVIL,ZESTRIL) 5 MG tablet Take 1 tablet (5 mg total) by mouth 2 (two) times daily.  60 tablet  6  . Multiple Vitamin (MULTIVITAMIN WITH MINERALS) TABS Take 1 tablet by mouth daily.      . nitroGLYCERIN (NITROSTAT) 0.4 MG SL tablet Place 1 tablet (0.4 mg total) under the tongue every 5 (five) minutes as needed for chest pain (up to 3 doses).  25 tablet  4  . potassium chloride SA (K-DUR,KLOR-CON) 20 MEQ tablet Take 1 tablet (20 mEq total) by mouth daily.  30 tablet  6    Discontinued Meds:   There are no discontinued medications.  Patient Active Problem List  Diagnosis  . Acute systolic CHF (congestive heart  failure)  . HTN (hypertension)  . Obesity  . Ischemic heart disease due to coronary artery obstruction    LABS Admission on 04/01/2012, Discharged on 04/04/2012  Component Date Value  . WBC 04/01/2012 7.4   . RBC 04/01/2012 3.38*  . Hemoglobin 04/01/2012 11.6*  . HCT 04/01/2012 34.5*  . MCV 04/01/2012 102.1*  . MCH 04/01/2012 34.3*  . MCHC 04/01/2012 33.6   . RDW 04/01/2012 16.0*  . Platelets 04/01/2012 222   . Neutrophils Relative 04/01/2012 76   . Neutro Abs 04/01/2012 5.7   . Lymphocytes Relative 04/01/2012 14   . Lymphs Abs 04/01/2012 1.1   . Monocytes Relative 04/01/2012 8   . Monocytes Absolute 04/01/2012 0.6   . Eosinophils Relative 04/01/2012 1   . Eosinophils Absolute 04/01/2012 0.1   . Basophils Relative 04/01/2012 0   . Basophils Absolute 04/01/2012 0.0   . Sodium 04/01/2012 141   . Potassium 04/01/2012 3.5   . Chloride 04/01/2012 109   . CO2 04/01/2012 22   . Glucose, Bld 04/01/2012 138*  . BUN  04/01/2012 12   . Creatinine, Ser 04/01/2012 1.04   . Calcium 04/01/2012 9.0   . Total Protein 04/01/2012 6.6   . Albumin 04/01/2012 2.9*  . AST 04/01/2012 32   . ALT 04/01/2012 25   . Alkaline Phosphatase 04/01/2012 96   . Total Bilirubin 04/01/2012 0.5   . GFR calc non Af Amer 04/01/2012 69*  . GFR calc Af Amer 04/01/2012 80*  . Pro B Natriuretic peptid* 04/01/2012 8916.0*  . Troponin I 04/01/2012 <0.30   . Troponin I 04/01/2012 <0.30   . Troponin I 04/01/2012 <0.30   . Troponin I 04/01/2012 <0.30   . TSH 04/01/2012 0.703   . Vitamin B-12 04/01/2012 464   . Folate 04/01/2012 >20.0   . Iron 04/01/2012 46   . TIBC 04/01/2012 274   . Saturation Ratios 04/01/2012 17*  . UIBC 04/01/2012 228   . Ferritin 04/01/2012 364*  . Hemoglobin A1C 04/01/2012 5.5   . Mean Plasma Glucose 04/01/2012 111   . Retic Ct Pct 04/01/2012 3.0   . RBC. 04/01/2012 3.36*  . Retic Count, Manual 04/01/2012 100.8   . MRSA by PCR 04/01/2012 NEGATIVE   . Sodium 04/02/2012 140   .  Potassium 04/02/2012 3.5   . Chloride 04/02/2012 104   . CO2 04/02/2012 24   . Glucose, Bld 04/02/2012 131*  . BUN 04/02/2012 17   . Creatinine, Ser 04/02/2012 1.11   . Calcium 04/02/2012 9.2   . GFR calc non Af Amer 04/02/2012 64*  . GFR calc Af Amer 04/02/2012 74*  . Cholesterol 04/02/2012 217*  . Triglycerides 04/02/2012 57   . HDL 04/02/2012 102   . Total CHOL/HDL Ratio 04/02/2012 2.1   . VLDL 04/02/2012 11   . LDL Cholesterol 04/02/2012 104*  . C difficile by pcr 04/02/2012 NEGATIVE   . Sodium 03/24/202013 140   . Potassium 03/24/202013 3.6   . Chloride 03/24/202013 104   . CO2 03/24/202013 27   . Glucose, Bld 03/24/202013 101*  . BUN 03/24/202013 23   . Creatinine, Ser 03/24/202013 1.26   . Calcium 03/24/202013 9.4   . GFR calc non Af Amer 03/24/202013 55*  . GFR calc Af Amer 03/24/202013 64*  . WBC 03/24/202013 5.9   . RBC 03/24/202013 3.29*  . Hemoglobin 03/24/202013 11.3*  . HCT 03/24/202013 33.7*  . MCV 03/24/202013 102.4*  . MCH 03/24/202013 34.3*  . MCHC 03/24/202013 33.5   . RDW 03/24/202013 16.0*  . Platelets 03/24/202013 252   . Prothrombin Time 03/24/202013 12.6   . INR 03/24/202013 0.95   . pH, Arterial 03/24/202013 7.399   . pCO2 arterial 03/24/202013 41.9   . pO2, Arterial 03/24/202013 83.0   . Bicarbonate 03/24/202013 25.9*  . TCO2 03/24/202013 27   . O2 Saturation 03/24/202013 96.0   . Acid-Base Excess 03/24/202013 1.0   . Sample type 03/24/202013 ARTERIAL   . pH, Ven 03/24/202013 7.367*  . pCO2, Ven 03/24/202013 46.1   . pO2, Ven 03/24/202013 32.0   . Bicarbonate 03/24/202013 26.5*  . TCO2 03/24/202013 28   . O2 Saturation 03/24/202013 59.0   . Acid-Base Excess 03/24/202013 1.0   . Sample type 03/24/202013 VENOUS   . Comment 03/24/202013 NOTIFIED PHYSICIAN   . Sodium 04/04/2012 140   . Potassium 04/04/2012 3.4*  . Chloride 04/04/2012 106   . CO2 04/04/2012 25   . Glucose, Bld 04/04/2012 105*  . BUN 04/04/2012 17   . Creatinine, Ser 04/04/2012 1.07   . Calcium 04/04/2012 9.1   .  GFR calc non Af Amer  04/04/2012 67*  . GFR calc Af Amer 04/04/2012 77*  . Activated Clotting Time 2020-04-2011 319   . WBC 04/04/2012 5.6   . RBC 04/04/2012 3.24*  . Hemoglobin 04/04/2012 10.8*  . HCT 04/04/2012 32.6*  . MCV 04/04/2012 100.6*  . [facility] 04/04/2012 33.3   . MCHC 04/04/2012 33.1   . RDW 04/04/2012 15.6*  . Platelets 04/04/2012 236      Results for this Opt Visit:     Results for orders placed during the hospital encounter of 04/01/12  CBC WITH DIFFERENTIAL      Component Value Range   WBC 7.4  4.0 - 10.5 K/uL   RBC 3.38 (*) 4.22 - 5.81 MIL/uL   Hemoglobin 11.6 (*) 13.0 - 17.0 g/dL   HCT 16.1 (*) 09.6 - 04.5 %   MCV 102.1 (*) 78.0 - 100.0 fL   MCH 34.3 (*) 26.0 - 34.0 pg   MCHC 33.6  30.0 - 36.0 g/dL   RDW 40.9 (*) 81.1 - 91.4 %   Platelets 222  150 - 400 K/uL   Neutrophils Relative 76  43 - 77 %   Neutro Abs 5.7  1.7 - 7.7 K/uL   Lymphocytes Relative 14  12 - 46 %   Lymphs Abs 1.1  0.7 - 4.0 K/uL   Monocytes Relative 8  3 - 12 %   Monocytes Absolute 0.6  0.1 - 1.0 K/uL   Eosinophils Relative 1  0 - 5 %   Eosinophils Absolute 0.1  0.0 - 0.7 K/uL   Basophils Relative 0  0 - 1 %   Basophils Absolute 0.0  0.0 - 0.1 K/uL  COMPREHENSIVE METABOLIC PANEL      Component Value Range   Sodium 141  135 - 145 mEq/L   Potassium 3.5  3.5 - 5.1 mEq/L   Chloride 109  96 - 112 mEq/L   CO2 22  19 - 32 mEq/L   Glucose, Bld 138 (*) 70 - 99 mg/dL   BUN 12  6 - 23 mg/dL   Creatinine, Ser 7.82  0.50 - 1.35 mg/dL   Calcium 9.0  8.4 - 95.6 mg/dL   Total Protein 6.6  6.0 - 8.3 g/dL   Albumin 2.9 (*) 3.5 - 5.2 g/dL   AST 32  0 - 37 U/L   ALT 25  0 - 53 U/L   Alkaline Phosphatase 96  39 - 117 U/L   Total Bilirubin 0.5  0.3 - 1.2 mg/dL   GFR calc non Af Amer 69 (*) >90 mL/min   GFR calc Af Amer 80 (*) >90 mL/min  PRO B NATRIURETIC PEPTIDE      Component Value Range   Pro B Natriuretic peptide (BNP) 8916.0 (*) 0 - 125 pg/mL  TROPONIN I      Component Value Range   Troponin I <0.30  <0.30 ng/mL   TROPONIN I      Component Value Range   Troponin I <0.30  <0.30 ng/mL  TROPONIN I      Component Value Range   Troponin I <0.30  <0.30 ng/mL  TROPONIN I      Component Value Range   Troponin I <0.30  <0.30 ng/mL  TSH      Component Value Range   TSH 0.703  0.350 - 4.500 uIU/mL  VITAMIN B12      Component Value Range   Vitamin B-12 464  211 - 911 pg/mL  FOLATE  Component Value Range   Folate >20.0    IRON AND TIBC      Component Value Range   Iron 46  42 - 135 ug/dL   TIBC 782  956 - 213 ug/dL   Saturation Ratios 17 (*) 20 - 55 %   UIBC 228  125 - 400 ug/dL  FERRITIN      Component Value Range   Ferritin 364 (*) 22 - 322 ng/mL  HEMOGLOBIN A1C      Component Value Range   Hemoglobin A1C 5.5  <5.7 %   Mean Plasma Glucose 111  <117 mg/dL  RETICULOCYTES      Component Value Range   Retic Ct Pct 3.0  0.4 - 3.1 %   RBC. 3.36 (*) 4.22 - 5.81 MIL/uL   Retic Count, Manual 100.8  19.0 - 186.0 K/uL  MRSA PCR SCREENING      Component Value Range   MRSA by PCR NEGATIVE  NEGATIVE  BASIC METABOLIC PANEL      Component Value Range   Sodium 140  135 - 145 mEq/L   Potassium 3.5  3.5 - 5.1 mEq/L   Chloride 104  96 - 112 mEq/L   CO2 24  19 - 32 mEq/L   Glucose, Bld 131 (*) 70 - 99 mg/dL   BUN 17  6 - 23 mg/dL   Creatinine, Ser 0.86  0.50 - 1.35 mg/dL   Calcium 9.2  8.4 - 57.8 mg/dL   GFR calc non Af Amer 64 (*) >90 mL/min   GFR calc Af Amer 74 (*) >90 mL/min  LIPID PANEL      Component Value Range   Cholesterol 217 (*) 0 - 200 mg/dL   Triglycerides 57  <469 mg/dL   HDL 629  >52 mg/dL   Total CHOL/HDL Ratio 2.1     VLDL 11  0 - 40 mg/dL   LDL Cholesterol 841 (*) 0 - 99 mg/dL  CLOSTRIDIUM DIFFICILE BY PCR      Component Value Range   C difficile by pcr NEGATIVE  NEGATIVE  BASIC METABOLIC PANEL      Component Value Range   Sodium 140  135 - 145 mEq/L   Potassium 3.6  3.5 - 5.1 mEq/L   Chloride 104  96 - 112 mEq/L   CO2 27  19 - 32 mEq/L   Glucose, Bld 101 (*) 70 - 99  mg/dL   BUN 23  6 - 23 mg/dL   Creatinine, Ser 3.24  0.50 - 1.35 mg/dL   Calcium 9.4  8.4 - 40.1 mg/dL   GFR calc non Af Amer 55 (*) >90 mL/min   GFR calc Af Amer 64 (*) >90 mL/min  CBC      Component Value Range   WBC 5.9  4.0 - 10.5 K/uL   RBC 3.29 (*) 4.22 - 5.81 MIL/uL   Hemoglobin 11.3 (*) 13.0 - 17.0 g/dL   HCT 02.7 (*) 25.3 - 66.4 %   MCV 102.4 (*) 78.0 - 100.0 fL   MCH 34.3 (*) 26.0 - 34.0 pg   MCHC 33.5  30.0 - 36.0 g/dL   RDW 40.3 (*) 47.4 - 25.9 %   Platelets 252  150 - 400 K/uL  PROTIME-INR      Component Value Range   Prothrombin Time 12.6  11.6 - 15.2 seconds   INR 0.95  0.00 - 1.49  POCT I-STAT 3, BLOOD GAS (G3+)      Component Value Range  pH, Arterial 7.399  7.350 - 7.450   pCO2 arterial 41.9  35.0 - 45.0 mmHg   pO2, Arterial 83.0  80.0 - 100.0 mmHg   Bicarbonate 25.9 (*) 20.0 - 24.0 mEq/L   TCO2 27  0 - 100 mmol/L   O2 Saturation 96.0     Acid-Base Excess 1.0  0.0 - 2.0 mmol/L   Sample type ARTERIAL    POCT I-STAT 3, BLOOD GAS (G3P V)      Component Value Range   pH, Ven 7.367 (*) 7.250 - 7.300   pCO2, Ven 46.1  45.0 - 50.0 mmHg   pO2, Ven 32.0  30.0 - 45.0 mmHg   Bicarbonate 26.5 (*) 20.0 - 24.0 mEq/L   TCO2 28  0 - 100 mmol/L   O2 Saturation 59.0     Acid-Base Excess 1.0  0.0 - 2.0 mmol/L   Sample type VENOUS     Comment NOTIFIED PHYSICIAN    BASIC METABOLIC PANEL      Component Value Range   Sodium 140  135 - 145 mEq/L   Potassium 3.4 (*) 3.5 - 5.1 mEq/L   Chloride 106  96 - 112 mEq/L   CO2 25  19 - 32 mEq/L   Glucose, Bld 105 (*) 70 - 99 mg/dL   BUN 17  6 - 23 mg/dL   Creatinine, Ser 4.54  0.50 - 1.35 mg/dL   Calcium 9.1  8.4 - 09.8 mg/dL   GFR calc non Af Amer 67 (*) >90 mL/min   GFR calc Af Amer 77 (*) >90 mL/min  POCT ACTIVATED CLOTTING TIME      Component Value Range   Activated Clotting Time 319    CBC      Component Value Range   WBC 5.6  4.0 - 10.5 K/uL   RBC 3.24 (*) 4.22 - 5.81 MIL/uL   Hemoglobin 10.8 (*) 13.0 - 17.0 g/dL    HCT 11.9 (*) 14.7 - 52.0 %   MCV 100.6 (*) 78.0 - 100.0 fL   MCH 33.3  26.0 - 34.0 pg   MCHC 33.1  30.0 - 36.0 g/dL   RDW 82.9 (*) 56.2 - 13.0 %   Platelets 236  150 - 400 K/uL    EKG Orders placed in visit on 04/10/12  . EKG 12-LEAD     Prior Assessment and Plan Problem List as of 04/10/2012          HTN (hypertension)   Acute systolic CHF (congestive heart failure)   Obesity   Ischemic heart disease due to coronary artery obstruction       Imaging: Dg Chest 1 View  04/02/2012  *RADIOLOGY REPORT*  Clinical Data: CHF  CHEST - 1 VIEW  Comparison: Portable exam 0630 hours compared to 04/01/2012  Findings: Enlargement of cardiac silhouette with pulmonary vascular congestion. Calcified tortuous aorta. Improved perihilar infiltrates. No gross pleural effusion or pneumothorax.  IMPRESSION: Improved pulmonary infiltrates consistent with improving edema.   Original Report Authenticated By: Ulyses Southward, M.D.    Dg Chest Portable 1 View  04/01/2012  *RADIOLOGY REPORT*  Clinical Data: Shortness of breath, history hypertension, carcinoma of the colon  PORTABLE CHEST - 1 VIEW  Comparison: Portable exam 0937 hours without priors for comparison. Correlation:  CT chest 07/12/2003  Findings: Enlargement of cardiac silhouette. Tortuous aorta with atherosclerotic calcification. Bilateral perihilar to basilar infiltrates question edema versus infection. No pleural effusion or pneumothorax. Bones unremarkable.  IMPRESSION: Enlargement of cardiac silhouette. Bilateral perihilar to basilar infiltrates  question pulmonary edema versus infection.   Original Report Authenticated By: Ulyses Southward, M.D.      [facility] Calculation: Score not calculated. Missing: Total Cholesterol

## 2012-04-11 LAB — BASIC METABOLIC PANEL
Glucose, Bld: 93 mg/dL (ref 70–99)
Potassium: 5 mEq/L (ref 3.5–5.3)
Sodium: 140 mEq/L (ref 135–145)

## 2012-04-13 ENCOUNTER — Encounter: Payer: Self-pay | Admitting: *Deleted

## 2012-05-21 ENCOUNTER — Ambulatory Visit (INDEPENDENT_AMBULATORY_CARE_PROVIDER_SITE_OTHER): Payer: Medicare Other | Admitting: Cardiovascular Disease

## 2012-05-21 ENCOUNTER — Encounter: Payer: Self-pay | Admitting: Cardiovascular Disease

## 2012-05-21 VITALS — BP 158/96 | HR 78 | Ht 71.0 in | Wt 202.0 lb

## 2012-05-21 DIAGNOSIS — I10 Essential (primary) hypertension: Secondary | ICD-10-CM

## 2012-05-21 DIAGNOSIS — I509 Heart failure, unspecified: Secondary | ICD-10-CM

## 2012-05-21 DIAGNOSIS — I5021 Acute systolic (congestive) heart failure: Secondary | ICD-10-CM

## 2012-05-21 DIAGNOSIS — IMO0001 Reserved for inherently not codable concepts without codable children: Secondary | ICD-10-CM

## 2012-05-21 DIAGNOSIS — IMO0002 Reserved for concepts with insufficient information to code with codable children: Secondary | ICD-10-CM

## 2012-05-21 DIAGNOSIS — I240 Acute coronary thrombosis not resulting in myocardial infarction: Secondary | ICD-10-CM

## 2012-05-21 MED ORDER — LISINOPRIL 10 MG PO TABS: 10.0000 mg | ORAL_TABLET | Freq: Two times a day (BID) | ORAL | Status: DC

## 2012-05-21 NOTE — Patient Instructions (Addendum)
Your physician recommends that you schedule a follow-up appointment in: 6 months  Your physician has recommended you make the following change in your medication:  1 - INCREASE your lisinopril to 10 mg twice a day

## 2012-05-21 NOTE — Assessment & Plan Note (Signed)
Euvolemic on current meds  Low sodium diet Increase ACE

## 2012-05-21 NOTE — Assessment & Plan Note (Signed)
No chest pain continue DAT until June

## 2012-05-21 NOTE — Assessment & Plan Note (Signed)
Elevated Increase lisinopril to 10 bid

## 2012-05-21 NOTE — Progress Notes (Signed)
Patient ID: Carlos Joseph, male   DOB: [birth year], 72 y.o.   MRN: [id number] 72 yo admitted at [facility] 12/4 with new onset CHF.  Transferred to [facility].  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  Minimal edema at end of day No PND and orthopnea.  No chest pain Compliant with meds  ROS: Denies fever, malais, weight loss, blurry vision, decreased visual acuity, cough, sputum, SOB, hemoptysis, pleuritic pain, palpitaitons, heartburn, abdominal pain, melena, lower extremity edema, claudication, or rash.  All other systems reviewed and negative  General: Affect appropriate Healthy:  appears stated age HEENT: normal Neck supple with no adenopathy JVP normal no bruits no thyromegaly Lungs clear with no wheezing and good diaphragmatic motion Heart:  S1/S2 SEM murmur, no rub, gallop or click PMI normal Abdomen: benighn, BS positve, no tenderness, no AAA no bruit.  No HSM or HJR Distal pulses intact with no bruits No edema Neuro non-focal Skin warm and dry No muscular weakness   Current Outpatient Prescriptions  Medication Sig Dispense Refill  . aspirin 81 MG EC tablet Take 1 tablet (81 mg total) by mouth daily.      Marland Kitchen atorvastatin (LIPITOR) 40 MG tablet Take 1 tablet (40 mg total) by mouth daily.  30 tablet  6  . carvedilol (COREG) 12.5 MG tablet Take 1 tablet (12.5 mg total) by mouth 2 (two) times daily.  60 tablet  3  . fish oil-omega-3 fatty acids 1000 MG capsule Take 2 g by mouth daily.      . furosemide (LASIX) 40 MG tablet Take 1 tablet (40 mg total) by mouth daily.  30 tablet  6  . isosorbide mononitrate (IMDUR) 30 MG 24 hr tablet Take 0.5 tablets (15 mg total) by mouth daily.  30 tablet  6  . lisinopril (PRINIVIL,ZESTRIL) 5 MG tablet Take 1 tablet (5 mg total) by mouth 2 (two) times daily.  60 tablet  6  . Multiple Vitamin (MULTIVITAMIN WITH MINERALS) TABS Take 1 tablet by mouth daily.        . nitroGLYCERIN (NITROSTAT) 0.4 MG SL tablet Place 1 tablet (0.4 mg total) under the tongue every 5 (five) minutes as needed for chest pain (up to 3 doses).  25 tablet  4  . potassium chloride SA (K-DUR,KLOR-CON) 20 MEQ tablet Take 1 tablet (20 mEq total) by mouth daily.  30 tablet  6    Allergies  Review of patient's allergies indicates no known allergies.  Electrocardiogram:  SR rate 86 LVH with strain  Assessment and Plan

## 2012-11-16 ENCOUNTER — Other Ambulatory Visit: Payer: Self-pay | Admitting: Adult Health

## 2012-11-16 NOTE — Telephone Encounter (Signed)
Medication sent via escribe for coreg. 

## 2012-11-18 ENCOUNTER — Ambulatory Visit: Payer: Medicare Other | Admitting: Cardiology

## 2012-11-23 ENCOUNTER — Ambulatory Visit (INDEPENDENT_AMBULATORY_CARE_PROVIDER_SITE_OTHER): Payer: Medicare Other | Admitting: Cardiology

## 2012-11-23 ENCOUNTER — Encounter: Payer: Self-pay | Admitting: Cardiology

## 2012-11-23 VITALS — BP 120/80 | HR 81 | Ht 71.0 in | Wt 193.0 lb

## 2012-11-23 DIAGNOSIS — I429 Cardiomyopathy, unspecified: Secondary | ICD-10-CM

## 2012-11-23 DIAGNOSIS — I10 Essential (primary) hypertension: Secondary | ICD-10-CM

## 2012-11-23 DIAGNOSIS — I2510 Atherosclerotic heart disease of native coronary artery without angina pectoris: Secondary | ICD-10-CM

## 2012-11-23 MED ORDER — LISINOPRIL 10 MG PO TABS: 10.0000 mg | ORAL_TABLET | Freq: Two times a day (BID) | ORAL | Status: DC

## 2012-11-23 NOTE — Assessment & Plan Note (Signed)
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 Weight is down significantly, he is clinically stable. Ran out of Prinivil recently, this will be refilled. Followup BMET and echocardiogram. Office visit in 3 months.

## 2012-11-23 NOTE — Patient Instructions (Addendum)
Your physician recommends that you schedule a follow-up appointment in: 3 months with PN   Your physician has requested that you have an echocardiogram. Echocardiography is a painless test that uses sound waves to create images of your heart. It provides your doctor with information about the size and shape of your heart and how well your heart's chambers and valves are working. This procedure takes approximately one hour. There are no restrictions for this procedure.  Your physician recommends that you return for lab work in: TODAY Counsellor GIVEN FOR BMET)

## 2012-11-23 NOTE — Assessment & Plan Note (Signed)
Blood pressure control is good today. 

## 2012-11-23 NOTE — Assessment & Plan Note (Signed)
No active angina status post PTCA of the PDA in December 2013.

## 2012-11-23 NOTE — Progress Notes (Signed)
Clinical Summary Carlos Joseph is a 72 y.o.male presenting for office followup. He is a patient Dr. Eden Joseph last seen in January. History reviewed below. This is my first meeting with him.  He reports NYHA class II dyspnea, no chest pain. Has lost a significant amount of weight since December, down from 217 pounds to 193 pounds. He reports no orthopnea or PND, no leg edema. States that he has been taking his medications regularly.  ECG today shows sinus rhythm with poor R wave progression.  No followup echocardiograms as documentation of cardiomyopathy in December 2013.   No Known Allergies  Current Outpatient Prescriptions  Medication Sig Dispense Refill  . aspirin 81 MG EC tablet Take 1 tablet (81 mg total) by mouth daily.      Marland Kitchen atorvastatin (LIPITOR) 40 MG tablet Take 1 tablet (40 mg total) by mouth daily.  30 tablet  6  . carvedilol (COREG) 12.5 MG tablet TAKE 1 TABLET (12.5 MG TOTAL) BY MOUTH 2 (TWO) TIMES DAILY.  60 tablet  0  . fish oil-omega-3 fatty acids 1000 MG capsule Take 2 g by mouth daily.      . furosemide (LASIX) 40 MG tablet Take 1 tablet (40 mg total) by mouth daily.  30 tablet  6  . Multiple Vitamin (MULTIVITAMIN WITH MINERALS) TABS Take 1 tablet by mouth daily.      . nitroGLYCERIN (NITROSTAT) 0.4 MG SL tablet Place 1 tablet (0.4 mg total) under the tongue every 5 (five) minutes as needed for chest pain (up to 3 doses).  25 tablet  4  . potassium chloride SA (K-DUR,KLOR-CON) 20 MEQ tablet Take 1 tablet (20 mEq total) by mouth daily.  30 tablet  6  . lisinopril (PRINIVIL,ZESTRIL) 10 MG tablet Take 1 tablet (10 mg total) by mouth 2 (two) times daily.  60 tablet  6   No current facility-administered medications for this visit.    Past Medical History  Diagnosis Date  . Essential hypertension, benign   . Colon cancer   . 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 Coronary atherosclerosis of native coronary artery     PTCA to PDA  04/03/12, residual disease managed medically  . Obesity     Social History [name] reports that he has been smoking Cigarettes and Cigars.  He has been smoking about 0.00 packs per day. His smokeless tobacco use includes Chew. [name] reports that  drinks alcohol.  Review of Systems No palpitations, dizziness, syncope. Has had a gout flare in his left hand. Otherwise negative.  Physical Examination Filed Vitals:   11/23/12 1311  BP: 120/80  Pulse: 81   Filed Weights   11/23/12 1311  Weight: 193 lb (87.544 kg)   Comfortable at rest. HEENT: Conjunctiva and lids normal, oropharynx clear with poor dentition. Neck: Supple, no elevated JVP or carotid bruits, no thyromegaly. Lungs: Diminished, clear to auscultation, nonlabored breathing at rest. Cardiac: Regular rate and rhythm, no S3. Abdomen: Soft, nontender, protuberant, bowel sounds present, no guarding or rebound. Extremities: No pitting edema, distal pulses 1-2+. Skin: Warm and dry. Musculoskeletal: No kyphosis. Neuropsychiatric: Alert and oriented x3, affect grossly appropriate.   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 Weight is down significantly, he is clinically stable. Ran out of Prinivil recently, this will be refilled. Followup BMET and echocardiogram.  Office visit in 3 months.  Coronary atherosclerosis of native coronary artery No active angina status post PTCA of the PDA in December 2013.  Essential hypertension, benign Blood pressure control is good today.    Jonelle Sidle, M.D., F.A.C.C.

## 2012-11-24 LAB — BASIC METABOLIC PANEL
CO2: 24 mEq/L (ref 19–32)
Chloride: 110 mEq/L (ref 96–112)
Creat: 1.72 mg/dL — ABNORMAL HIGH (ref 0.50–1.35)
Glucose, Bld: 99 mg/dL (ref 70–99)
Sodium: 141 mEq/L (ref 135–145)

## 2012-11-25 ENCOUNTER — Other Ambulatory Visit: Payer: Self-pay | Admitting: *Deleted

## 2012-11-25 MED ORDER — FUROSEMIDE 40 MG PO TABS: ORAL_TABLET | ORAL | Status: DC

## 2012-11-25 MED ORDER — LISINOPRIL 10 MG PO TABS: 10.0000 mg | ORAL_TABLET | Freq: Every day | ORAL | Status: DC

## 2012-11-25 NOTE — Addendum Note (Signed)
Addended by: Thompson Grayer on: 11/25/2012 02:51 PM   Modules accepted: Orders

## 2012-12-22 ENCOUNTER — Other Ambulatory Visit: Payer: Self-pay | Admitting: Physician Assistant

## 2012-12-22 ENCOUNTER — Other Ambulatory Visit (HOSPITAL_COMMUNITY): Payer: Self-pay | Admitting: Internal Medicine

## 2013-01-04 ENCOUNTER — Ambulatory Visit (HOSPITAL_COMMUNITY): Payer: Medicare Other | Attending: Cardiology

## 2013-01-22 ENCOUNTER — Encounter (HOSPITAL_COMMUNITY): Payer: Self-pay

## 2013-01-22 ENCOUNTER — Emergency Department (HOSPITAL_COMMUNITY): Payer: Medicare Other

## 2013-01-22 ENCOUNTER — Emergency Department (HOSPITAL_COMMUNITY)
Admission: EM | Admit: 2013-01-22 | Discharge: 2013-01-22 | Disposition: A | Discharge status: Home / Self Care | Payer: Medicare Other | Attending: Emergency Medicine | Admitting: Emergency Medicine

## 2013-01-22 DIAGNOSIS — M25562 Pain in left knee: Secondary | ICD-10-CM

## 2013-01-22 DIAGNOSIS — X500XXA Overexertion from strenuous movement or load, initial encounter: Secondary | ICD-10-CM | POA: Insufficient documentation

## 2013-01-22 DIAGNOSIS — Z7982 Long term (current) use of aspirin: Secondary | ICD-10-CM | POA: Insufficient documentation

## 2013-01-22 DIAGNOSIS — R296 Repeated falls: Secondary | ICD-10-CM | POA: Insufficient documentation

## 2013-01-22 DIAGNOSIS — E669 Obesity, unspecified: Secondary | ICD-10-CM | POA: Insufficient documentation

## 2013-01-22 DIAGNOSIS — I2510 Atherosclerotic heart disease of native coronary artery without angina pectoris: Secondary | ICD-10-CM | POA: Insufficient documentation

## 2013-01-22 DIAGNOSIS — I10 Essential (primary) hypertension: Secondary | ICD-10-CM | POA: Insufficient documentation

## 2013-01-22 DIAGNOSIS — Y939 Activity, unspecified: Secondary | ICD-10-CM | POA: Insufficient documentation

## 2013-01-22 DIAGNOSIS — S8990XA Unspecified injury of unspecified lower leg, initial encounter: Secondary | ICD-10-CM | POA: Insufficient documentation

## 2013-01-22 DIAGNOSIS — F17200 Nicotine dependence, unspecified, uncomplicated: Secondary | ICD-10-CM | POA: Insufficient documentation

## 2013-01-22 DIAGNOSIS — Y929 Unspecified place or not applicable: Secondary | ICD-10-CM | POA: Insufficient documentation

## 2013-01-22 DIAGNOSIS — Z79899 Other long term (current) drug therapy: Secondary | ICD-10-CM | POA: Insufficient documentation

## 2013-01-22 DIAGNOSIS — Z85038 Personal history of other malignant neoplasm of large intestine: Secondary | ICD-10-CM | POA: Insufficient documentation

## 2013-01-22 MED ORDER — NAPROXEN 500 MG PO TABS: 500.0000 mg | ORAL_TABLET | Freq: Two times a day (BID) | ORAL | Status: DC

## 2013-01-22 MED ORDER — PREDNISONE 50 MG PO TABS: 50.0000 mg | ORAL_TABLET | Freq: Every day | ORAL | Status: DC

## 2013-01-22 MED ORDER — PREDNISONE 20 MG PO TABS: ORAL_TABLET | ORAL | Status: DC

## 2013-01-22 MED ORDER — ACETAMINOPHEN 500 MG PO TABS
1000.0000 mg | ORAL_TABLET | Freq: Once | ORAL | Status: AC
  Administered 2013-01-22: 1000 mg via ORAL
  Filled 2013-01-22: qty 2

## 2013-01-22 NOTE — ED Provider Notes (Signed)
CSN: [id number]     Arrival date & time 01/22/13  1155 History  This chart was scribed for Donnetta Hutching, MD by Shari Heritage, ED Scribe. The patient was seen in room APA12/APA12. Patient's care was started at 12:30 PM.    Chief Complaint  Patient presents with  . Knee Pain    The history is provided by the patient. No language interpreter was used.    HPI Comments: Carlos Joseph is a 72 y.o. male who presents to the Emergency Department complaining of constant, moderate, non-radiating left knee pain onset 5 days ago. Patient states that he had a fall one week ago and may have twisted his knee at that time. He cannot think of any other injuries that may have contributed to current symptoms. He denies any other injuries or symptoms at this time. He has a history of HTN, colon cancer, cardiomyopathy and coronary atherosclerosis.    Past Medical History  Diagnosis Date  . Essential hypertension, benign   . Colon cancer   . 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 Coronary atherosclerosis of native coronary artery     PTCA to PDA 04/03/12, residual disease managed medically  . Obesity    Past Surgical History  Procedure Laterality Date  . Colon surgery     No family history on file. History  Substance Use Topics  . Smoking status: Current Some Day Smoker    Types: Cigarettes, Cigars  . Smokeless tobacco: Current User    Types: Chew     Comment: states he only chews on the cigars does not light them  . Alcohol Use: Yes     Comment: Liquor-half pint twice a week    Review of Systems A complete 10 system review of systems was obtained and all systems are negative except as noted in the HPI and PMH.   Allergies  Review of patient's allergies indicates no known allergies.  Home Medications   Current Outpatient Rx  Name  Route  Sig  Dispense  Refill  . aspirin 81 MG EC tablet   Oral   Take 1 tablet (81 mg total) by mouth daily.         Marland Kitchen  atorvastatin (LIPITOR) 40 MG tablet      TAKE ONE TABLET BY MOUTH EVERY DAY   30 tablet   5   . carvedilol (COREG) 12.5 MG tablet      TAKE 1 TABLET (12.5 MG TOTAL) BY MOUTH 2 (TWO) TIMES DAILY.   60 tablet   0   . fish oil-omega-3 fatty acids 1000 MG capsule   Oral   Take 2 g by mouth daily.         . furosemide (LASIX) 40 MG tablet      TAKE ONE TABLET BY MOUTH EVERY DAY   30 tablet   5   . lisinopril (PRINIVIL,ZESTRIL) 10 MG tablet   Oral   Take 1 tablet (10 mg total) by mouth daily.   60 tablet   6   . Multiple Vitamin (MULTIVITAMIN WITH MINERALS) TABS   Oral   Take 1 tablet by mouth daily.         . nitroGLYCERIN (NITROSTAT) 0.4 MG SL tablet   Sublingual   Place 1 tablet (0.4 mg total) under the tongue every 5 (five) minutes as needed for chest pain (up to 3 doses).   25 tablet   4   . potassium chloride  SA (K-DUR,KLOR-CON) 20 MEQ tablet      TAKE ONE TABLET BY MOUTH EVERY DAY   30 tablet   5    Triage Vitals: BP 166/106  Pulse 98  Temp(Src) 99.5 F (37.5 C) (Oral)  Resp 18  Ht 5\' 11"  (1.803 m)  Wt 193 lb (87.544 kg)  BMI 26.93 kg/m2  SpO2 99% Physical Exam  Constitutional: He is oriented to person, place, and time. He appears well-developed and well-nourished.  HENT:  Head: Normocephalic and atraumatic.  Musculoskeletal: Normal range of motion.  Tenderness to the medial and lateral aspects of the left knee. Generalized edema to the left knee.  Neurological: He is alert and oriented to person, place, and time.  Skin: Skin is warm and dry.  Psychiatric: He has a normal mood and affect. His behavior is normal.    ED Course  Procedures (including critical care time) DIAGNOSTIC STUDIES: Oxygen Saturation is 99% on room air, normal by my interpretation.    COORDINATION OF CARE: 12:34 PM- Will order x-ray of the knee. Patient informed of current plan for treatment and evaluation and agrees with plan at this time.    Labs Review Labs  Reviewed - No data to display   Imaging Review Dg Knee Complete 4 Views Left  01/22/2013   CLINICAL DATA:  Left knee pain and swelling for 1 week.  EXAM: LEFT KNEE - COMPLETE 4+ VIEW  COMPARISON:  None.  FINDINGS: Moderate knee effusion noted with tricompartmental osteoarthritis. Concave cephalad surface of the lateral tibial plateau is likely chronic. Lateral compartmental articular space loss. No discrete fracture. Irregular marginal spurring along the tibial plateau.  Atherosclerosis noted.  IMPRESSION: 1. Moderate to large knee effusion. 2. Osteoarthritis. 3. If pain persists despite conservative therapy, MRI may be warranted for further characterization.   Electronically Signed   By: Herbie Baltimore   On: 01/22/2013 13:29    MDM  No diagnosis found. Patient opted to have conservative treatment.   Elevate, ice, Ace wrap, Naprosyn 500 mg, prednisone, referral to orthopedics I personally performed the services described in this documentation, which was scribed in my presence. The recorded information has been reviewed and is accurate.   Donnetta Hutching, MD 01/22/13 [phone]

## 2013-01-22 NOTE — ED Notes (Signed)
Pt reports left knee pain for 1 week, denies any known injury.

## 2013-02-25 ENCOUNTER — Ambulatory Visit (HOSPITAL_COMMUNITY)
Admission: RE | Admit: 2013-02-25 | Discharge: 2013-02-25 | Disposition: A | Discharge status: Home / Self Care | Payer: Medicare Other | Source: Ambulatory Visit | Attending: Cardiology | Admitting: Cardiology

## 2013-02-25 DIAGNOSIS — F17200 Nicotine dependence, unspecified, uncomplicated: Secondary | ICD-10-CM | POA: Insufficient documentation

## 2013-02-25 DIAGNOSIS — I2510 Atherosclerotic heart disease of native coronary artery without angina pectoris: Secondary | ICD-10-CM | POA: Insufficient documentation

## 2013-02-25 DIAGNOSIS — I10 Essential (primary) hypertension: Secondary | ICD-10-CM | POA: Insufficient documentation

## 2013-02-25 DIAGNOSIS — I428 Other cardiomyopathies: Secondary | ICD-10-CM | POA: Insufficient documentation

## 2013-02-25 DIAGNOSIS — I379 Nonrheumatic pulmonary valve disorder, unspecified: Secondary | ICD-10-CM

## 2013-02-25 DIAGNOSIS — I429 Cardiomyopathy, unspecified: Secondary | ICD-10-CM

## 2013-02-25 NOTE — Progress Notes (Signed)
*  PRELIMINARY RESULTS* Echocardiogram 2D Echocardiogram has been performed.  Capers Hagmann 02/25/2013, 3:17 PM

## 2013-03-01 ENCOUNTER — Encounter: Payer: Self-pay | Admitting: Adult Health

## 2013-03-01 ENCOUNTER — Ambulatory Visit (INDEPENDENT_AMBULATORY_CARE_PROVIDER_SITE_OTHER): Payer: Medicare Other | Admitting: Adult Health

## 2013-03-01 VITALS — BP 130/89 | HR 94 | Ht 71.0 in | Wt 182.0 lb

## 2013-03-01 DIAGNOSIS — I2510 Atherosclerotic heart disease of native coronary artery without angina pectoris: Secondary | ICD-10-CM

## 2013-03-01 DIAGNOSIS — I10 Essential (primary) hypertension: Secondary | ICD-10-CM

## 2013-03-01 MED ORDER — FUROSEMIDE 40 MG PO TABS: 40.0000 mg | ORAL_TABLET | Freq: Every day | ORAL | Status: AC

## 2013-03-01 MED ORDER — ATORVASTATIN CALCIUM 40 MG PO TABS: 40.0000 mg | ORAL_TABLET | Freq: Every day | ORAL | Status: AC

## 2013-03-01 MED ORDER — CARVEDILOL 12.5 MG PO TABS: 12.5000 mg | ORAL_TABLET | Freq: Two times a day (BID) | ORAL | Status: AC

## 2013-03-01 MED ORDER — POTASSIUM CHLORIDE CRYS ER 20 MEQ PO TBCR: 20.0000 meq | EXTENDED_RELEASE_TABLET | Freq: Every day | ORAL | Status: DC

## 2013-03-01 MED ORDER — ATORVASTATIN CALCIUM 40 MG PO TABS: 40.0000 mg | ORAL_TABLET | Freq: Every day | ORAL | Status: DC

## 2013-03-01 MED ORDER — LISINOPRIL 10 MG PO TABS: 10.0000 mg | ORAL_TABLET | Freq: Every day | ORAL | Status: AC

## 2013-03-01 MED ORDER — CARVEDILOL 12.5 MG PO TABS: 12.5000 mg | ORAL_TABLET | Freq: Two times a day (BID) | ORAL | Status: DC

## 2013-03-01 MED ORDER — LISINOPRIL 10 MG PO TABS: 10.0000 mg | ORAL_TABLET | Freq: Every day | ORAL | Status: DC

## 2013-03-01 NOTE — Addendum Note (Signed)
Addended by: Thompson Grayer on: 03/01/2013 02:38 PM   Modules accepted: Orders

## 2013-03-01 NOTE — Assessment & Plan Note (Signed)
He is doing very well without complaints without complaints of significant DOE. He is avoiding salt. He continues medically compliant. He has lost 10 lbs with diet and exercise.

## 2013-03-01 NOTE — Patient Instructions (Addendum)
Your physician recommends that you schedule a follow-up appointment in: 6 months You will receive a reminder letter two months in advance reminding you to call and schedule your appointment. If you don't receive this letter, please contact our office.  Your physician recommends that you continue on your current medications as directed. Please refer to the Current Medication list given to you today.     

## 2013-03-01 NOTE — Addendum Note (Signed)
Addended by: Thompson Grayer on: 03/01/2013 02:30 PM   Modules accepted: Orders

## 2013-03-01 NOTE — Progress Notes (Signed)
    HPI: Mr. Seyler is a 72 year old patient of Dr. Eden Emms we are following for ongoing assessment and management of CAD, hypertension, nonischemic cardiomyopathy. The patient was last seen by Dr. Diona Browner on 11/23/2012, at that time an echocardiogram and a BMET were ordered. He was stable. It was thought that his heart myopathy was related to history of alcohol use.    Echocardiogram was completed on 02/25/2013, revealing mild concentric hypertrophy, EF of 70-75%. Grade 1 diastolic dysfunction. No aortic valve regurg with mild mitral regurg. Labs demonstrated sodium 141 potassium 4.4 chloride 110 CO2 24 BUN 23 creatinine 1.72.    He is without complaints. He has lost 10 lbs and is watching his salt intake.  He is medically compliant.   No Known Allergies  Current Outpatient Prescriptions  Medication Sig Dispense Refill  . aspirin 81 MG EC tablet Take 1 tablet (81 mg total) by mouth daily.      Marland Kitchen atorvastatin (LIPITOR) 40 MG tablet Take 40 mg by mouth daily.      . carvedilol (COREG) 12.5 MG tablet Take 12.5 mg by mouth 2 (two) times daily with a meal.      . fish oil-omega-3 fatty acids 1000 MG capsule Take 2 g by mouth daily.      . furosemide (LASIX) 40 MG tablet Take 40 mg by mouth.      Marland Kitchen lisinopril (PRINIVIL,ZESTRIL) 10 MG tablet Take 1 tablet (10 mg total) by mouth daily.  60 tablet  6  . Multiple Vitamin (MULTIVITAMIN WITH MINERALS) TABS Take 1 tablet by mouth daily.      . naproxen (NAPROSYN) 500 MG tablet Take 1 tablet (500 mg total) by mouth 2 (two) times daily.  30 tablet  0  . nitroGLYCERIN (NITROSTAT) 0.4 MG SL tablet Place 1 tablet (0.4 mg total) under the tongue every 5 (five) minutes as needed for chest pain (up to 3 doses).  25 tablet  4  . potassium chloride SA (K-DUR,KLOR-CON) 20 MEQ tablet Take 20 mEq by mouth daily.       No current facility-administered medications for this visit.    Past Medical History  Diagnosis Date  . Essential hypertension, benign   . Colon  cancer   . 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 Coronary atherosclerosis of native coronary artery     PTCA to PDA 04/03/12, residual disease managed medically  . Obesity     Past Surgical History  Procedure Laterality Date  . Colon surgery      ROS: Review of systems complete and found to be negative unless listed above  PHYSICAL EXAM BP 130/89  Pulse 94  Ht 5\' 11"  (1.803 m)  Wt 182 lb (82.555 kg)  BMI 25.40 kg/m2  General: Well developed, well nourished, in no acute distress Head: Eyes PERRLA, No xanthomas.   Normal cephalic and atramatic  Lungs: Clear bilaterally to auscultation and percussion. Heart: HRRR S1 S2, without MRG.  Pulses are 2+ & equal.            No carotid bruit. No JVD.  No abdominal bruits. No femoral bruits. Abdomen: Bowel sounds are positive, abdomen soft and non-tender without masses or  Hernia's noted. Msk:  Back normal, normal gait. Normal strength and tone for age. Extremities: No clubbing, cyanosis or edema.  DP +1 Neuro: Alert and oriented X 3. Psych:  Good affect, responds appropriately    ASSESSMENT AND PLAN

## 2013-03-01 NOTE — Assessment & Plan Note (Signed)
His BP is well controlled. Will continue on carvedilol, lasix and lisinopril.  Creatinine is 1.72.  Will continue him on current medical regimen. See him again in 6 months.

## 2013-03-01 NOTE — Assessment & Plan Note (Signed)
No complaints of chest pain or palpitations,  Continue current  Medical regimen.

## 2013-05-26 ENCOUNTER — Emergency Department (HOSPITAL_COMMUNITY)
Admission: EM | Admit: 2013-05-26 | Discharge: 2013-05-26 | Disposition: A | Discharge status: Home / Self Care | Payer: Medicare Other | Attending: Emergency Medicine | Admitting: Emergency Medicine

## 2013-05-26 ENCOUNTER — Encounter (HOSPITAL_COMMUNITY): Payer: Self-pay | Admitting: Emergency Medicine

## 2013-05-26 DIAGNOSIS — E669 Obesity, unspecified: Secondary | ICD-10-CM | POA: Insufficient documentation

## 2013-05-26 DIAGNOSIS — F17200 Nicotine dependence, unspecified, uncomplicated: Secondary | ICD-10-CM | POA: Insufficient documentation

## 2013-05-26 DIAGNOSIS — M109 Gout, unspecified: Secondary | ICD-10-CM | POA: Insufficient documentation

## 2013-05-26 DIAGNOSIS — R011 Cardiac murmur, unspecified: Secondary | ICD-10-CM | POA: Insufficient documentation

## 2013-05-26 DIAGNOSIS — Z7982 Long term (current) use of aspirin: Secondary | ICD-10-CM | POA: Insufficient documentation

## 2013-05-26 DIAGNOSIS — Z85038 Personal history of other malignant neoplasm of large intestine: Secondary | ICD-10-CM | POA: Insufficient documentation

## 2013-05-26 DIAGNOSIS — I2510 Atherosclerotic heart disease of native coronary artery without angina pectoris: Secondary | ICD-10-CM | POA: Insufficient documentation

## 2013-05-26 DIAGNOSIS — I10 Essential (primary) hypertension: Secondary | ICD-10-CM | POA: Insufficient documentation

## 2013-05-26 DIAGNOSIS — M549 Dorsalgia, unspecified: Secondary | ICD-10-CM | POA: Insufficient documentation

## 2013-05-26 DIAGNOSIS — Z79899 Other long term (current) drug therapy: Secondary | ICD-10-CM | POA: Insufficient documentation

## 2013-05-26 HISTORY — DX: Gout, unspecified: M10.9

## 2013-05-26 MED ORDER — NAPROXEN 500 MG PO TABS: 500.0000 mg | ORAL_TABLET | Freq: Two times a day (BID) | ORAL | Status: AC

## 2013-05-26 MED ORDER — PREDNISONE 50 MG PO TABS
60.0000 mg | ORAL_TABLET | Freq: Once | ORAL | Status: AC
  Administered 2013-05-26: 13:00:00 60 mg via ORAL
  Filled 2013-05-26 (×2): qty 1

## 2013-05-26 MED ORDER — ONDANSETRON 4 MG PO TBDP
4.0000 mg | ORAL_TABLET | Freq: Once | ORAL | Status: AC
  Administered 2013-05-26: 4 mg via ORAL
  Filled 2013-05-26: qty 1

## 2013-05-26 MED ORDER — HYDROCODONE-ACETAMINOPHEN 5-325 MG PO TABS
2.0000 | ORAL_TABLET | Freq: Once | ORAL | Status: AC
Start: 2013-05-26 — End: 2013-05-26
  Administered 2013-05-26: 2 via ORAL
  Filled 2013-05-26: qty 2

## 2013-05-26 MED ORDER — HYDROCODONE-ACETAMINOPHEN 5-325 MG PO TABS: 1.0000 | ORAL_TABLET | Freq: Four times a day (QID) | ORAL | Status: AC | PRN

## 2013-05-26 MED ORDER — DEXAMETHASONE 4 MG PO TABS: ORAL_TABLET | ORAL | Status: AC

## 2013-05-26 MED ORDER — NAPROXEN 250 MG PO TABS
500.0000 mg | ORAL_TABLET | Freq: Once | ORAL | Status: AC
  Administered 2013-05-26: 500 mg via ORAL
  Filled 2013-05-26: qty 2

## 2013-05-26 NOTE — ED Provider Notes (Signed)
Medical screening examination/treatment/procedure(s) were conducted as a shared visit with non-physician practitioner(s) and myself.  I personally evaluated the patient during the encounter.  EKG Interpretation   None       Patient seen by me. Patient with history of gout now with redness pain increased warmth to right wrist 34 days. Consistent with gout. Patient also has a little bit of inflammation that starting on that right little finger at the DIP joint as well. Do concur with the mid-level that symptoms seem to be consistent with gout. Treatment for gout.  Mervin Kung, MD 05/26/13 [phone]

## 2013-05-26 NOTE — ED Provider Notes (Signed)
CSN: [id number]     Arrival date & time 05/26/13  1108 History   First MD Initiated Contact with Patient 05/26/13 1244     Chief Complaint  Patient presents with  . Gout   (Consider location/radiation/quality/duration/timing/severity/associated sxs/prior Treatment) HPI Comments: Patient is a 73 year old male who presents to the emergency department with a complaint of" gout". The patient states that for the last 3-4 days he's been having increasing redness and pain of his right wrist. He states that time the pain and some of the swelling goes half way up his form. He has problems because of pain making a grip. He has problems with moving the wrist on the right. The patient states he's had similar symptoms to this in the past and it was related to gout.  The patient also complains of pain involving the and swelling involving the left knee. The patient states that the left knee pain is been going on for 2-3 days. It hurts when he is standing on her walking on it. It is not bothering him when he is sitting still. He's not had any injury to the left knee.  There is some swelling of the fingers of the right hand. One of to have nodules present. Patient states this is not new, but is sore since the wrist has been bothering him. His been no high fever reported. There's been no fall or injury reported. The patient states he has tried Tylenol but this is not helping.  The history is provided by the patient.    Past Medical History  Diagnosis Date  . Essential hypertension, benign   . Colon cancer   . 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 Coronary atherosclerosis of native coronary artery     PTCA to PDA 04/03/12, residual disease managed medically  . Obesity   . Gout    Past Surgical History  Procedure Laterality Date  . Colon surgery     History reviewed. No pertinent family history. History  Substance Use Topics  . Smoking status: Current Some Day  Smoker    Types: Cigarettes, Cigars  . Smokeless tobacco: Current User    Types: Chew     Comment: states he only chews on the cigars does not light them  . Alcohol Use: Yes     Comment: Liquor-half pint twice a week    Review of Systems  Constitutional: Negative for activity change.       All ROS Neg except as noted in HPI  HENT: Negative for nosebleeds.   Eyes: Negative for photophobia and discharge.  Respiratory: Negative for cough, shortness of breath and wheezing.   Cardiovascular: Negative for chest pain and palpitations.  Gastrointestinal: Negative for abdominal pain and blood in stool.  Genitourinary: Negative for dysuria, frequency and hematuria.  Musculoskeletal: Positive for arthralgias and back pain. Negative for neck pain.  Skin: Negative.   Neurological: Negative for dizziness, seizures and speech difficulty.  Psychiatric/Behavioral: Negative for hallucinations and confusion.    Allergies  Review of patient's allergies indicates no known allergies.  Home Medications   Current Outpatient Rx  Name  Route  Sig  Dispense  Refill  . aspirin 81 MG EC tablet   Oral   Take 1 tablet (81 mg total) by mouth daily.         Marland Kitchen atorvastatin (LIPITOR) 40 MG tablet   Oral   Take 1 tablet (40 mg total) by mouth daily.  30 tablet   6   . carvedilol (COREG) 12.5 MG tablet   Oral   Take 1 tablet (12.5 mg total) by mouth 2 (two) times daily with a meal.   60 tablet   6   . fish oil-omega-3 fatty acids 1000 MG capsule   Oral   Take 2 g by mouth daily.         . furosemide (LASIX) 40 MG tablet   Oral   Take 1 tablet (40 mg total) by mouth daily.   30 tablet   6   . lisinopril (PRINIVIL,ZESTRIL) 10 MG tablet   Oral   Take 1 tablet (10 mg total) by mouth daily.   60 tablet   6   . Multiple Vitamin (MULTIVITAMIN WITH MINERALS) TABS   Oral   Take 1 tablet by mouth daily.         . naproxen sodium (ANAPROX) 220 MG tablet   Oral   Take 440 mg by mouth daily  as needed (pain).         . nitroGLYCERIN (NITROSTAT) 0.4 MG SL tablet   Sublingual   Place 1 tablet (0.4 mg total) under the tongue every 5 (five) minutes as needed for chest pain (up to 3 doses).   25 tablet   4   . potassium chloride SA (K-DUR,KLOR-CON) 20 MEQ tablet   Oral   Take 1 tablet (20 mEq total) by mouth daily.   30 tablet   6   . dexamethasone (DECADRON) 4 MG tablet      1 po bid with food   12 tablet   0   . HYDROcodone-acetaminophen (NORCO) 5-325 MG per tablet   Oral   Take 1 tablet by mouth every 6 (six) hours as needed for moderate pain.   20 tablet   0   . naproxen (NAPROSYN) 500 MG tablet   Oral   Take 1 tablet (500 mg total) by mouth 2 (two) times daily.   12 tablet   0    BP 150/87  Pulse 96  Temp(Src) 98.1 F (36.7 C) (Oral)  Resp 20  SpO2 100% Physical Exam  Nursing note and vitals reviewed. Constitutional: He is oriented to person, place, and time. He appears well-developed and well-nourished.  Non-toxic appearance.  HENT:  Head: Normocephalic.  Right Ear: Tympanic membrane and external ear normal.  Left Ear: Tympanic membrane and external ear normal.  Eyes: EOM and lids are normal. Pupils are equal, round, and reactive to light.  Neck: Normal range of motion. Neck supple. Carotid bruit is not present.  Cardiovascular: Normal rate, regular rhythm, intact distal pulses and normal pulses.  Exam reveals no friction rub.   Murmur heard. Pulmonary/Chest: Breath sounds normal. No respiratory distress.  Abdominal: Soft. Bowel sounds are normal. There is no tenderness. There is no guarding.  Musculoskeletal: Normal range of motion.  There is swelling of the right wrist. There is increased redness of the right wrist. The right wrist is warm but not hot. There is pain with attempted range of motion. There is no red streaking going up the arm. The radial pulse is 2+ bilaterally. There is degenerative joint disease deformities including some nodules  of the fingers of the right hand.  There is decreased range of motion of the left hip. There is decreased range of motion and some swelling of the left knee. There is an effusion of the left knee. The left knee is not hot. There is good range  of motion of the left ankle and toes. Is no pitting edema of the lower extremities.  Lymphadenopathy:       Head (right side): No submandibular adenopathy present.       Head (left side): No submandibular adenopathy present.    He has no cervical adenopathy.  Neurological: He is alert and oriented to person, place, and time. He has normal strength. No cranial nerve deficit or sensory deficit.  Skin: Skin is warm and dry.  Psychiatric: He has a normal mood and affect. His speech is normal.    ED Course  Procedures (including critical care time) Labs Review Labs Reviewed - No data to display Imaging Review No results found.  EKG Interpretation   None       MDM   1. Gout, arthritis    *I have reviewed nursing notes, vital signs, and all appropriate lab and imaging results for this patient.**  Vital signs are within normal limits with exception of the blood pressure being 150/87. The pulse oximetry is 100% on room air. Within normal limits by my interpretation.  The patient was treated in the emergency department with Naprosyn, prednisone, and Norco. The patient states he is getting some relief from this. Prescriptions for Norco, Decadron, and Naprosyn given to the patient. Patient advised to take this medication with food. He is further advised to followup with Dr. Everette Rank for office followup.  Lenox Ahr, PA-C 05/26/13 1421

## 2013-05-26 NOTE — Discharge Instructions (Signed)
Please see Dr. Everette Rank for office recheck next week. Please use Decadron and Naprosyn daily. Please take these medications with food. May use Norco for pain. This medication may cause drowsiness, please use with caution. Arthritis, Nonspecific Arthritis is inflammation of a joint. This usually means pain, redness, warmth or swelling are present. One or more joints may be involved. There are a number of types of arthritis. Your caregiver may not be able to tell what type of arthritis you have right away. CAUSES  The most common cause of arthritis is the wear and tear on the joint (osteoarthritis). This causes damage to the cartilage, which can break down over time. The knees, hips, back and neck are most often affected by this type of arthritis. Other types of arthritis and common causes of joint pain include:  Sprains and other injuries near the joint. Sometimes minor sprains and injuries cause pain and swelling that develop hours later.  Rheumatoid arthritis. This affects hands, feet and knees. It usually affects both sides of your body at the same time. It is often associated with chronic ailments, fever, weight loss and general weakness.  Crystal arthritis. Gout and pseudo gout can cause occasional acute severe pain, redness and swelling in the foot, ankle, or knee.  Infectious arthritis. Bacteria can get into a joint through a break in overlying skin. This can cause infection of the joint. Bacteria and viruses can also spread through the blood and affect your joints.  Drug, infectious and allergy reactions. Sometimes joints can become mildly painful and slightly swollen with these types of illnesses. SYMPTOMS   Pain is the main symptom.  Your joint or joints can also be red, swollen and warm or hot to the touch.  You may have a fever with certain types of arthritis, or even feel overall ill.  The joint with arthritis will hurt with movement. Stiffness is present with some types of  arthritis. DIAGNOSIS  Your caregiver will suspect arthritis based on your description of your symptoms and on your exam. Testing may be needed to find the type of arthritis:  Blood and sometimes urine tests.  X-ray tests and sometimes CT or MRI scans.  Removal of fluid from the joint (arthrocentesis) is done to check for bacteria, crystals or other causes. Your caregiver (or a specialist) will numb the area over the joint with a local anesthetic, and use a needle to remove joint fluid for examination. This procedure is only minimally uncomfortable.  Even with these tests, your caregiver may not be able to tell what kind of arthritis you have. Consultation with a specialist (rheumatologist) may be helpful. TREATMENT  Your caregiver will discuss with you treatment specific to your type of arthritis. If the specific type cannot be determined, then the following general recommendations may apply. Treatment of severe joint pain includes:  Rest.  Elevation.  Anti-inflammatory medication (for example, ibuprofen) may be prescribed. Avoiding activities that cause increased pain.  Only take over-the-counter or prescription medicines for pain and discomfort as recommended by your caregiver.  Cold packs over an inflamed joint may be used for 10 to 15 minutes every hour. Hot packs sometimes feel better, but do not use overnight. Do not use hot packs if you are diabetic without your caregiver's permission.  A cortisone shot into arthritic joints may help reduce pain and swelling.  Any acute arthritis that gets worse over the next 1 to 2 days needs to be looked at to be sure there is no joint infection.  Long-term arthritis treatment involves modifying activities and lifestyle to reduce joint stress jarring. This can include weight loss. Also, exercise is needed to nourish the joint cartilage and remove waste. This helps keep the muscles around the joint strong. HOME CARE INSTRUCTIONS   Do not take  aspirin to relieve pain if gout is suspected. This elevates uric acid levels.  Only take over-the-counter or prescription medicines for pain, discomfort or fever as directed by your caregiver.  Rest the joint as much as possible.  If your joint is swollen, keep it elevated.  Use crutches if the painful joint is in your leg.  Drinking plenty of fluids may help for certain types of arthritis.  Follow your caregiver's dietary instructions.  Try low-impact exercise such as:  Swimming.  Water aerobics.  Biking.  Walking.  Morning stiffness is often relieved by a warm shower.  Put your joints through regular range-of-motion. SEEK MEDICAL CARE IF:   You do not feel better in 24 hours or are getting worse.  You have side effects to medications, or are not getting better with treatment. SEEK IMMEDIATE MEDICAL CARE IF:   You have a fever.  You develop severe joint pain, swelling or redness.  Many joints are involved and become painful and swollen.  There is severe back pain and/or leg weakness.  You have loss of bowel or bladder control. Document Released: 05/23/2004 Document Revised: 07/08/2011 Document Reviewed: 06/08/2008 [facility] Patient Information 2014 [employer].

## 2013-05-26 NOTE — ED Notes (Signed)
Pt presents with pain, swelling, redness to right wrist x3-4 days. Pt also reports discomfort in left knee and ankle. Pt has hx of gout.

## 2013-07-21 ENCOUNTER — Other Ambulatory Visit: Payer: Self-pay

## 2013-07-21 DIAGNOSIS — I2510 Atherosclerotic heart disease of native coronary artery without angina pectoris: Secondary | ICD-10-CM

## 2013-07-21 DIAGNOSIS — I10 Essential (primary) hypertension: Secondary | ICD-10-CM

## 2013-07-21 MED ORDER — POTASSIUM CHLORIDE CRYS ER 20 MEQ PO TBCR: 20.0000 meq | EXTENDED_RELEASE_TABLET | Freq: Every day | ORAL | Status: AC

## 2014-04-07 ENCOUNTER — Encounter (HOSPITAL_COMMUNITY): Payer: Self-pay | Admitting: Cardiology

## 2014-06-26 ENCOUNTER — Other Ambulatory Visit: Payer: Self-pay | Admitting: Adult Health

## 2018-02-05 DIAGNOSIS — I10 Essential (primary) hypertension: Secondary | ICD-10-CM | POA: Diagnosis not present

## 2018-02-05 DIAGNOSIS — Z6824 Body mass index (BMI) 24.0-24.9, adult: Secondary | ICD-10-CM | POA: Diagnosis not present

## 2018-02-05 DIAGNOSIS — F17220 Nicotine dependence, chewing tobacco, uncomplicated: Secondary | ICD-10-CM | POA: Diagnosis not present

## 2018-02-05 DIAGNOSIS — Z85038 Personal history of other malignant neoplasm of large intestine: Secondary | ICD-10-CM | POA: Diagnosis not present

## 2019-01-19 ENCOUNTER — Other Ambulatory Visit: Payer: Self-pay

## 2019-01-24 DIAGNOSIS — W07XXXA Fall from chair, initial encounter: Secondary | ICD-10-CM | POA: Diagnosis not present

## 2019-01-24 DIAGNOSIS — R531 Weakness: Secondary | ICD-10-CM | POA: Diagnosis not present

## 2019-01-24 DIAGNOSIS — R29810 Facial weakness: Secondary | ICD-10-CM | POA: Diagnosis not present

## 2019-01-24 DIAGNOSIS — I517 Cardiomegaly: Secondary | ICD-10-CM | POA: Diagnosis not present

## 2019-01-24 DIAGNOSIS — Y92000 Kitchen of unspecified non-institutional (private) residence as  the place of occurrence of the external cause: Secondary | ICD-10-CM | POA: Diagnosis not present

## 2019-01-24 DIAGNOSIS — R29709 NIHSS score 9: Secondary | ICD-10-CM | POA: Diagnosis not present

## 2019-01-24 DIAGNOSIS — G8194 Hemiplegia, unspecified affecting left nondominant side: Secondary | ICD-10-CM | POA: Diagnosis not present

## 2019-01-24 DIAGNOSIS — I639 Cerebral infarction, unspecified: Secondary | ICD-10-CM | POA: Diagnosis not present

## 2019-01-24 DIAGNOSIS — W19XXXA Unspecified fall, initial encounter: Secondary | ICD-10-CM | POA: Diagnosis not present

## 2019-01-24 DIAGNOSIS — I69354 Hemiplegia and hemiparesis following cerebral infarction affecting left non-dominant side: Secondary | ICD-10-CM | POA: Diagnosis not present

## 2019-01-24 DIAGNOSIS — R4781 Slurred speech: Secondary | ICD-10-CM | POA: Diagnosis not present

## 2019-01-24 DIAGNOSIS — I10 Essential (primary) hypertension: Secondary | ICD-10-CM | POA: Diagnosis not present

## 2019-01-24 DIAGNOSIS — Z87891 Personal history of nicotine dependence: Secondary | ICD-10-CM | POA: Diagnosis not present

## 2019-01-25 DIAGNOSIS — I10 Essential (primary) hypertension: Secondary | ICD-10-CM | POA: Diagnosis not present

## 2019-01-25 DIAGNOSIS — I5042 Chronic combined systolic (congestive) and diastolic (congestive) heart failure: Secondary | ICD-10-CM | POA: Diagnosis not present

## 2019-01-25 DIAGNOSIS — R2990 Unspecified symptoms and signs involving the nervous system: Secondary | ICD-10-CM | POA: Diagnosis not present

## 2019-01-25 DIAGNOSIS — M25431 Effusion, right wrist: Secondary | ICD-10-CM | POA: Diagnosis not present

## 2019-01-25 DIAGNOSIS — I639 Cerebral infarction, unspecified: Secondary | ICD-10-CM | POA: Diagnosis not present

## 2019-01-25 DIAGNOSIS — M25531 Pain in right wrist: Secondary | ICD-10-CM | POA: Diagnosis not present

## 2019-01-25 DIAGNOSIS — Z7982 Long term (current) use of aspirin: Secondary | ICD-10-CM | POA: Diagnosis not present

## 2019-01-25 DIAGNOSIS — M109 Gout, unspecified: Secondary | ICD-10-CM | POA: Diagnosis not present

## 2019-01-25 DIAGNOSIS — I6782 Cerebral ischemia: Secondary | ICD-10-CM | POA: Diagnosis not present

## 2019-01-25 DIAGNOSIS — Z9282 Status post administration of tPA (rtPA) in a different facility within the last 24 hours prior to admission to current facility: Secondary | ICD-10-CM | POA: Diagnosis not present

## 2019-01-25 DIAGNOSIS — I428 Other cardiomyopathies: Secondary | ICD-10-CM | POA: Diagnosis not present

## 2019-01-25 DIAGNOSIS — M7989 Other specified soft tissue disorders: Secondary | ICD-10-CM | POA: Diagnosis not present

## 2019-01-25 DIAGNOSIS — W19XXXA Unspecified fall, initial encounter: Secondary | ICD-10-CM | POA: Diagnosis not present

## 2019-01-25 DIAGNOSIS — R29709 NIHSS score 9: Secondary | ICD-10-CM | POA: Diagnosis not present

## 2019-01-25 DIAGNOSIS — Z8679 Personal history of other diseases of the circulatory system: Secondary | ICD-10-CM | POA: Diagnosis not present

## 2019-01-25 DIAGNOSIS — D329 Benign neoplasm of meninges, unspecified: Secondary | ICD-10-CM | POA: Diagnosis not present

## 2019-01-25 DIAGNOSIS — Z87891 Personal history of nicotine dependence: Secondary | ICD-10-CM | POA: Diagnosis not present

## 2019-01-25 DIAGNOSIS — Z79899 Other long term (current) drug therapy: Secondary | ICD-10-CM | POA: Diagnosis not present

## 2019-01-25 DIAGNOSIS — R9082 White matter disease, unspecified: Secondary | ICD-10-CM | POA: Diagnosis not present

## 2019-01-25 DIAGNOSIS — I2510 Atherosclerotic heart disease of native coronary artery without angina pectoris: Secondary | ICD-10-CM | POA: Diagnosis not present

## 2019-01-25 DIAGNOSIS — I63511 Cerebral infarction due to unspecified occlusion or stenosis of right middle cerebral artery: Secondary | ICD-10-CM | POA: Diagnosis not present

## 2019-01-25 DIAGNOSIS — G8194 Hemiplegia, unspecified affecting left nondominant side: Secondary | ICD-10-CM | POA: Diagnosis not present

## 2019-01-25 DIAGNOSIS — Z85038 Personal history of other malignant neoplasm of large intestine: Secondary | ICD-10-CM | POA: Diagnosis not present

## 2019-01-25 DIAGNOSIS — I517 Cardiomegaly: Secondary | ICD-10-CM | POA: Diagnosis not present

## 2019-01-26 DIAGNOSIS — I2510 Atherosclerotic heart disease of native coronary artery without angina pectoris: Secondary | ICD-10-CM | POA: Diagnosis not present

## 2019-01-26 DIAGNOSIS — M109 Gout, unspecified: Secondary | ICD-10-CM | POA: Diagnosis not present

## 2019-01-26 DIAGNOSIS — M25531 Pain in right wrist: Secondary | ICD-10-CM | POA: Diagnosis not present

## 2019-01-26 DIAGNOSIS — I428 Other cardiomyopathies: Secondary | ICD-10-CM | POA: Diagnosis not present

## 2019-01-26 DIAGNOSIS — I10 Essential (primary) hypertension: Secondary | ICD-10-CM | POA: Diagnosis not present

## 2019-01-26 DIAGNOSIS — I63511 Cerebral infarction due to unspecified occlusion or stenosis of right middle cerebral artery: Secondary | ICD-10-CM | POA: Diagnosis not present

## 2019-01-26 DIAGNOSIS — R2990 Unspecified symptoms and signs involving the nervous system: Secondary | ICD-10-CM | POA: Diagnosis not present

## 2019-01-26 DIAGNOSIS — I639 Cerebral infarction, unspecified: Secondary | ICD-10-CM | POA: Diagnosis not present

## 2019-01-26 DIAGNOSIS — M25431 Effusion, right wrist: Secondary | ICD-10-CM | POA: Diagnosis not present

## 2019-01-27 DIAGNOSIS — I63511 Cerebral infarction due to unspecified occlusion or stenosis of right middle cerebral artery: Secondary | ICD-10-CM | POA: Diagnosis not present

## 2019-01-27 DIAGNOSIS — R2990 Unspecified symptoms and signs involving the nervous system: Secondary | ICD-10-CM | POA: Diagnosis not present

## 2019-01-27 DIAGNOSIS — I639 Cerebral infarction, unspecified: Secondary | ICD-10-CM | POA: Diagnosis not present

## 2019-01-27 DIAGNOSIS — I2510 Atherosclerotic heart disease of native coronary artery without angina pectoris: Secondary | ICD-10-CM | POA: Diagnosis not present

## 2019-01-27 DIAGNOSIS — M109 Gout, unspecified: Secondary | ICD-10-CM | POA: Diagnosis not present

## 2019-01-28 DIAGNOSIS — I2510 Atherosclerotic heart disease of native coronary artery without angina pectoris: Secondary | ICD-10-CM | POA: Diagnosis not present

## 2019-01-28 DIAGNOSIS — R2990 Unspecified symptoms and signs involving the nervous system: Secondary | ICD-10-CM | POA: Diagnosis not present

## 2019-01-28 DIAGNOSIS — M109 Gout, unspecified: Secondary | ICD-10-CM | POA: Diagnosis not present

## 2019-01-29 DIAGNOSIS — R2990 Unspecified symptoms and signs involving the nervous system: Secondary | ICD-10-CM | POA: Diagnosis not present

## 2019-01-29 DIAGNOSIS — M109 Gout, unspecified: Secondary | ICD-10-CM | POA: Diagnosis not present

## 2019-01-30 DIAGNOSIS — M109 Gout, unspecified: Secondary | ICD-10-CM | POA: Diagnosis not present

## 2019-01-30 DIAGNOSIS — R2990 Unspecified symptoms and signs involving the nervous system: Secondary | ICD-10-CM | POA: Diagnosis not present

## 2019-02-01 ENCOUNTER — Other Ambulatory Visit: Payer: Self-pay

## 2019-02-01 NOTE — Patient Outreach (Signed)
[facility]) Care Management  02/01/2019  THURMON GREENEY [birth year] [id number]     Transition of Care Referral  Referral Date: 02/01/2019 Referral Source: [facility] Discharge report Date of Admission: unknown Diagnosis: "cerebral infarction" Date of Discharge: 01/30/2019 [place]: [facility]    Outreach attempt # 1 to patient. No answer after multiple rings and unable to leave message.     Plan: RN CM will make outreach attempt to patient within 3-4 business days. RN CM will send unsuccessful outreach letter to patient.    Enzo Montgomery, RN,BSN,CCM [facility] Telephonic Care Management Coordinator Direct Phone: [phone] Toll Free: [phone] Fax: [phone]

## 2019-02-02 ENCOUNTER — Other Ambulatory Visit: Payer: Self-pay

## 2019-02-02 NOTE — Patient Outreach (Signed)
[facility]) Care Management  02/02/2019  Carlos Joseph [birth year] [id number]   Transition of Care Referral  Referral Date: 02/01/2019 Referral Source: [facility] Discharge report Date of Admission: unknown Diagnosis: "cerebral infarction" Date of Discharge: 01/30/2019 [place]   Outreach attempt #2 to patient.No answer at present.      Plan: RN CM will make outreach attempt to patient within 3-4 business days.   Enzo Montgomery, RN,BSN,CCM [place] Management Telephonic Care Management Coordinator Direct Phone: [phone] Toll Free: [phone] Fax: [phone]

## 2019-02-04 ENCOUNTER — Other Ambulatory Visit: Payer: Self-pay

## 2019-02-04 NOTE — Patient Outreach (Signed)
[facility]) Care Management  02/04/2019  KAIMANA PARKER 1941-02-19 [id number]   Transition of Care Referral  Referral Date:02/01/2019 Referral Source:[employer] Discharge report Date of Admission:unknown Diagnosis:"cerebral infarction" Date of Discharge:01/30/2019 [place] Medicare   Outreach attempt #3 to patient. No answer at present and unable to leave message.      Plan: RN CM will close case if no response from letter mailed to patient.    Enzo Montgomery, RN,BSN,CCM [place] Management Telephonic Care Management Coordinator Direct Phone: [phone] Toll Free: [phone] Fax: [phone]

## 2019-02-11 ENCOUNTER — Emergency Department: Payer: Medicare HMO

## 2019-02-11 ENCOUNTER — Other Ambulatory Visit: Payer: Self-pay

## 2019-02-11 ENCOUNTER — Emergency Department
Admission: EM | Admit: 2019-02-11 | Discharge: 2019-02-11 | Disposition: A | Discharge status: Home / Self Care | Payer: Medicare HMO | Attending: Emergency Medicine | Admitting: Emergency Medicine

## 2019-02-11 ENCOUNTER — Encounter: Payer: Self-pay | Admitting: Emergency Medicine

## 2019-02-11 DIAGNOSIS — I10 Essential (primary) hypertension: Secondary | ICD-10-CM | POA: Diagnosis not present

## 2019-02-11 DIAGNOSIS — I2510 Atherosclerotic heart disease of native coronary artery without angina pectoris: Secondary | ICD-10-CM | POA: Diagnosis not present

## 2019-02-11 DIAGNOSIS — F17210 Nicotine dependence, cigarettes, uncomplicated: Secondary | ICD-10-CM | POA: Insufficient documentation

## 2019-02-11 DIAGNOSIS — R0602 Shortness of breath: Secondary | ICD-10-CM | POA: Diagnosis not present

## 2019-02-11 DIAGNOSIS — I447 Left bundle-branch block, unspecified: Secondary | ICD-10-CM

## 2019-02-11 DIAGNOSIS — Z79899 Other long term (current) drug therapy: Secondary | ICD-10-CM | POA: Insufficient documentation

## 2019-02-11 DIAGNOSIS — Z7982 Long term (current) use of aspirin: Secondary | ICD-10-CM | POA: Diagnosis not present

## 2019-02-11 DIAGNOSIS — R9431 Abnormal electrocardiogram [ECG] [EKG]: Secondary | ICD-10-CM | POA: Diagnosis not present

## 2019-02-11 DIAGNOSIS — R0989 Other specified symptoms and signs involving the circulatory and respiratory systems: Secondary | ICD-10-CM | POA: Diagnosis not present

## 2019-02-11 HISTORY — DX: Cerebral infarction, unspecified: I63.9

## 2019-02-11 LAB — BASIC METABOLIC PANEL
Anion gap: 5 (ref 5–15)
BUN: 17 mg/dL (ref 8–23)
CO2: 22 mmol/L (ref 22–32)
Calcium: 9.2 mg/dL (ref 8.9–10.3)
Chloride: 114 mmol/L — ABNORMAL HIGH (ref 98–111)
Creatinine, Ser: 0.97 mg/dL (ref 0.61–1.24)
GFR calc Af Amer: >60 mL/min (ref >60)
GFR calc non Af Amer: >60 mL/min (ref >60)
Glucose, Bld: 88 mg/dL (ref 70–99)
Potassium: 3.9 mmol/L (ref 3.5–5.1)
Sodium: 141 mmol/L (ref 135–145)

## 2019-02-11 LAB — CBC WITH DIFFERENTIAL/PLATELET
Abs Immature Granulocytes: 0.01 10*3/uL (ref 0.00–0.07)
Basophils Absolute: 0 10*3/uL (ref 0.0–0.1)
Basophils Relative: 1 %
Eosinophils Absolute: 0.3 10*3/uL (ref 0.0–0.5)
Eosinophils Relative: 8 %
HCT: 33.8 % — ABNORMAL LOW (ref 39.0–52.0)
Hemoglobin: 10.7 g/dL — ABNORMAL LOW (ref 13.0–17.0)
Immature Granulocytes: 0 %
Lymphocytes Relative: 32 %
Lymphs Abs: 1.4 10*3/uL (ref 0.7–4.0)
MCH: 34.1 pg — ABNORMAL HIGH (ref 26.0–34.0)
MCHC: 31.7 g/dL (ref 30.0–36.0)
MCV: 107.6 fL — ABNORMAL HIGH (ref 80.0–100.0)
Monocytes Absolute: 0.5 10*3/uL (ref 0.1–1.0)
Monocytes Relative: 12 %
Neutro Abs: 2.1 10*3/uL (ref 1.7–7.7)
Neutrophils Relative %: 47 %
Platelets: 204 10*3/uL (ref 150–400)
RBC: 3.14 MIL/uL — ABNORMAL LOW (ref 4.22–5.81)
RDW: 14 % (ref 11.5–15.5)
WBC: 4.4 10*3/uL (ref 4.0–10.5)
nRBC: 0 % (ref 0.0–0.2)

## 2019-02-11 LAB — TROPONIN I (HIGH SENSITIVITY): Troponin I (High Sensitivity): 14 ng/L (ref <18)

## 2019-02-11 LAB — MAGNESIUM: Magnesium: 1.9 mg/dL (ref 1.7–2.4)

## 2019-02-11 NOTE — ED Notes (Signed)
EDP Isaacs at bedside 

## 2019-02-11 NOTE — ED Notes (Signed)
Urine sample sent to lab

## 2019-02-11 NOTE — ED Notes (Signed)
Pt denies SOB/CP.

## 2019-02-11 NOTE — ED Notes (Signed)
Lab notified of add on

## 2019-02-11 NOTE — ED Notes (Signed)
Pt family updated

## 2019-02-11 NOTE — ED Triage Notes (Signed)
Patient brought by ems from [facility] for new onset LBBB on ekg done during routine visit today.  patinet has no complaints.  Per ems patient did not have this on October 3rd--vss patient had stroke on sept 27th and has not started his eloquis due to some education or prescription issues.

## 2019-02-11 NOTE — ED Provider Notes (Signed)
[facility] Emergency Department Provider Note  ____________________________________________   First MD Initiated Contact with Patient 02/11/19 1652     (approximate)  I have reviewed the triage vital signs and the nursing notes.   HISTORY  Chief Complaint Abnormal ECG    HPI Carlos Joseph is a 78 y.o. male with past medical history as below here with no complaints.  He was sent here by his new PCP.  Per report, patient was recently hospitalized at [facility] for a stroke.  He was noted to have LVH versus bundle branch at that time.  He states that he actually followed up with a primary care doctor today, and was noted to have left bundle branch block on his EKG.  Subsequently sent here.  He states that he has had absolutely no chest pain, shortness of breath, lightheadedness, or other symptoms.  He is felt no palpitations.  He has been taking his aspirin and other anticoagulants as prescribed.  He states his strokelike symptoms have essentially resolved.  No other issues.        Past Medical History:  Diagnosis Date  . 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 Colon cancer ([place])   . Coronary atherosclerosis of native coronary artery    PTCA to PDA 04/03/12, residual disease managed medically  . Essential hypertension, benign   . Gout   . Obesity   . Stroke [facility])     Patient Active Problem List   Diagnosis Date Noted  . Coronary atherosclerosis of native coronary artery 04/04/2012  . 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 Essential hypertension, benign 04/01/2012    Past Surgical History:  Procedure Laterality Date  . COLON SURGERY    . CORONARY ANGIOGRAM  04/03/2012   Procedure: CORONARY ANGIOGRAM;  Surgeon: Peter M Martinique, MD;  Location: [facility] CATH LAB;  Service: Cardiovascular;;  . PERCUTANEOUS CORONARY INTERVENTION-BALLOON ONLY  04/03/2012   Procedure: PERCUTANEOUS CORONARY  INTERVENTION-BALLOON ONLY;  Surgeon: Peter M Martinique, MD;  Location: [facility] CATH LAB;  Service: Cardiovascular;;  . RIGHT HEART CATHETERIZATION  04/03/2012   Procedure: RIGHT HEART CATH;  Surgeon: Peter M Martinique, MD;  Location: [facility] CATH LAB;  Service: Cardiovascular;;    Prior to Admission medications   Medication Sig Start Date End Date Taking? Authorizing Provider  aspirin 81 MG EC tablet Take 1 tablet (81 mg total) by mouth daily. 04/04/12   Dunn, Nedra Hai, PA-C  atorvastatin (LIPITOR) 40 MG tablet Take 1 tablet (40 mg total) by mouth daily. 03/01/13   Lendon Colonel, NP  carvedilol (COREG) 12.5 MG tablet Take 1 tablet (12.5 mg total) by mouth 2 (two) times daily with a meal. 03/01/13   Lendon Colonel, NP  dexamethasone (DECADRON) 4 MG tablet 1 po bid with food 05/26/13   Lily Kocher, PA-C  fish oil-omega-3 fatty acids 1000 MG capsule Take 2 g by mouth daily.    [provider]  furosemide (LASIX) 40 MG tablet Take 1 tablet (40 mg total) by mouth daily. 03/01/13   Lendon Colonel, NP  HYDROcodone-acetaminophen (NORCO) 5-325 MG per tablet Take 1 tablet by mouth every 6 (six) hours as needed for moderate pain. 05/26/13   Lily Kocher, PA-C  lisinopril (PRINIVIL,ZESTRIL) 10 MG tablet Take 1 tablet (10 mg total) by mouth daily. 03/01/13   Lendon Colonel, NP  Multiple Vitamin (MULTIVITAMIN WITH MINERALS) TABS Take 1 tablet by mouth daily.    [provider]  naproxen (NAPROSYN) 500 MG tablet Take 1 tablet (500 mg total) by mouth 2 (two) times daily. 05/26/13   Lily Kocher, PA-C  naproxen sodium (ANAPROX) 220 MG tablet Take 440 mg by mouth daily as needed (pain).    [provider]  nitroGLYCERIN (NITROSTAT) 0.4 MG SL tablet Place 1 tablet (0.4 mg total) under the tongue every 5 (five) minutes as needed for chest pain (up to 3 doses). 04/04/12   Dunn, Nedra Hai, PA-C  potassium chloride SA (K-DUR,KLOR-CON) 20 MEQ tablet Take 1 tablet (20 mEq total) by mouth daily.  07/21/13   Josue Hector, MD    Allergies Patient has no known allergies.  No family history on file.  Social History Social History   Tobacco Use  . Smoking status: Current Some Day Smoker    Types: Cigarettes, Cigars  . Smokeless tobacco: Current User    Types: Chew  . Tobacco comment: states he only chews on the cigars does not light them  Substance Use Topics  . Alcohol use: Yes    Comment: Liquor-half pint twice a week  . Drug use: No    Review of Systems  Review of Systems  Constitutional: Negative for chills, fatigue and fever.  HENT: Negative for sore throat.   Respiratory: Negative for shortness of breath.   Cardiovascular: Negative for chest pain.  Gastrointestinal: Negative for abdominal pain.  Genitourinary: Negative for flank pain.  Musculoskeletal: Negative for neck pain.  Skin: Negative for rash and wound.  Allergic/Immunologic: Negative for immunocompromised state.  Neurological: Negative for weakness and numbness.  Hematological: Does not bruise/bleed easily.     ____________________________________________  PHYSICAL EXAM:      VITAL SIGNS: ED Triage Vitals  Enc Vitals Group     BP 02/11/19 1650 (!) 164/98     Pulse Rate 02/11/19 1650 77     Resp 02/11/19 1650 16     Temp 02/11/19 1650 97.9 F (36.6 C)     Temp Source 02/11/19 1650 Oral     SpO2 02/11/19 1650 98 %     Weight 02/11/19 1651 162 lb 0.6 oz (73.5 kg)     Height 02/11/19 1651 5\' 11"  (1.803 m)     Head Circumference --      Peak Flow --      Pain Score 02/11/19 1651 0     Pain Loc --      Pain Edu? --      Excl. in [place]? --      Physical Exam Vitals signs and nursing note reviewed.  Constitutional:      General: He is not in acute distress.    Appearance: He is well-developed.  HENT:     Head: Normocephalic and atraumatic.  Eyes:     Conjunctiva/sclera: Conjunctivae normal.  Neck:     Musculoskeletal: Neck supple.  Cardiovascular:     Rate and Rhythm: Normal rate and  regular rhythm.     Heart sounds: Normal heart sounds. No murmur. No friction rub.  Pulmonary:     Effort: Pulmonary effort is normal. No respiratory distress.     Breath sounds: Normal breath sounds. No wheezing or rales.  Abdominal:     General: There is no distension.     Palpations: Abdomen is soft.     Tenderness: There is no abdominal tenderness.  Skin:    General: Skin is warm.     Capillary Refill: Capillary refill takes less than 2 seconds.  Neurological:  Mental Status: He is alert and oriented to person, place, and time.     Motor: No abnormal muscle tone.       ____________________________________________   LABS (all labs ordered are listed, but only abnormal results are displayed)  Labs Reviewed  CBC WITH DIFFERENTIAL/PLATELET - Abnormal; Notable for the following components:      Result Value   RBC 3.14 (*)    Hemoglobin 10.7 (*)    HCT 33.8 (*)    MCV 107.6 (*)    MCH 34.1 (*)    All other components within normal limits  BASIC METABOLIC PANEL - Abnormal; Notable for the following components:   Chloride 114 (*)    All other components within normal limits  TROPONIN I (HIGH SENSITIVITY)  TROPONIN I (HIGH SENSITIVITY)    ____________________________________________  EKG: Sinus rhythm, left bundle branch block, with no Sgarbossa criteria. VR 80, QRS 186, QTC 530. No ischemic changes. ________________________________________  RADIOLOGY All imaging, including plain films, CT scans, and ultrasounds, independently reviewed by me, and interpretations confirmed via formal radiology reads.  ED MD interpretation:   CXR: Clear, no focal abnormality  Official radiology report(s): Dg Chest 2 View  Result Date: 02/11/2019 CLINICAL DATA:  Shortness of breath. Abnormal EKG findings. EXAM: CHEST - 2 VIEW COMPARISON:  04/02/2012 FINDINGS: Stable cardiomegaly. Aortic calcified and tortuous. No focal airspace consolidation. No pleural effusion or pneumothorax.  IMPRESSION: Stable cardiomegaly. No active pulmonary disease. Electronically Signed   By: Davina Poke M.D.   On: 02/11/2019 18:31    ____________________________________________  PROCEDURES   Procedure(s) performed (including Critical Care):  Procedures  ____________________________________________  INITIAL IMPRESSION / MDM / [place] / ED COURSE  As part of my medical decision making, I reviewed the following data within the electronic MEDICAL RECORD NUMBER Notes from prior ED visits and [place] Controlled Substance Database      *KARA HOLLIGAN was evaluated in Emergency Department on 02/11/2019 for the symptoms described in the history of present illness. He was evaluated in the context of the global COVID-19 pandemic, which necessitated consideration that the patient might be at risk for infection with the SARS-CoV-2 virus that causes COVID-19. Institutional protocols and algorithms that pertain to the evaluation of patients at risk for COVID-19 are in a state of rapid change based on information released by regulatory bodies including the CDC and federal and state organizations. These policies and algorithms were followed during the patient's care in the ED.  Some ED evaluations and interventions may be delayed as a result of limited staffing during the pandemic.*      Medical Decision Making:  78 yo M here with asymptomatic LBBB. LVH with early LBBB has been noted on old EKGs. Trop neg and unchanged from recent admission. Labs otherwise at baseline. He has NO CP, SOB, or sx to suggest ACS or ischemia. I suspect this is chronic underlying heart disease versus mild change 2/2 recent CVA. Will d/c with Cardiology referral.  ____________________________________________  FINAL CLINICAL IMPRESSION(S) / ED DIAGNOSES  Final diagnoses:  LBBB (left bundle branch block)     MEDICATIONS GIVEN DURING THIS VISIT:  Medications - No data to display   ED Discharge Orders    None        Note:  This document was prepared using Dragon voice recognition software and may include unintentional dictation errors.   Duffy Bruce, MD 02/11/19 1902

## 2019-02-11 NOTE — ED Notes (Signed)
Family member at bedside requesting a magnesium be added onto bloodwork. EDP Isaacs notified.

## 2019-02-11 NOTE — Discharge Instructions (Signed)
As we discussed, your EKG showed what is known is a left bundle branch block.  This should be seen by a cardiologist for follow-up, but there were no signs of acute injury to your heart at this time.  This could be related to recent stroke.  Continue all your medications.

## 2019-02-11 NOTE — ED Notes (Signed)
Pt urinated into urinal.  

## 2019-02-12 ENCOUNTER — Other Ambulatory Visit: Payer: Self-pay

## 2019-02-12 NOTE — Patient Outreach (Addendum)
[facility]) Care Management  02/12/2019  SAMER BOTBYL [birth year] [id number]   Transition of Care Referral  Referral Date:02/01/2019 Referral Source:[employer] Discharge report Date of Admission:unknown Diagnosis:"cerebral infarction" Date of Discharge:01/30/2019 [place] Medicare    Multiple attempts to establish contact with patient without success. No response from letter mailed to patient. Case is being closed at this time. Also noted in patient's chart that he has new PCP which is not in [place]. Patient no longer eligible for services.   Plan: RN CM will close case at this time.   Enzo Montgomery, RN,BSN,CCM [place] Management Telephonic Care Management Coordinator Direct Phone: [phone] Toll Free: [phone] Fax: [phone]

## 2019-02-23 DIAGNOSIS — Z111 Encounter for screening for respiratory tuberculosis: Secondary | ICD-10-CM | POA: Diagnosis not present

## 2019-03-11 DIAGNOSIS — I426 Alcoholic cardiomyopathy: Secondary | ICD-10-CM | POA: Diagnosis not present

## 2019-03-11 DIAGNOSIS — I447 Left bundle-branch block, unspecified: Secondary | ICD-10-CM | POA: Diagnosis not present

## 2019-03-11 DIAGNOSIS — E785 Hyperlipidemia, unspecified: Secondary | ICD-10-CM | POA: Diagnosis not present

## 2019-03-11 DIAGNOSIS — I10 Essential (primary) hypertension: Secondary | ICD-10-CM | POA: Diagnosis not present

## 2019-03-11 DIAGNOSIS — I639 Cerebral infarction, unspecified: Secondary | ICD-10-CM | POA: Diagnosis not present

## 2019-04-04 ENCOUNTER — Encounter (HOSPITAL_COMMUNITY): Payer: Self-pay | Admitting: Emergency Medicine

## 2019-04-04 ENCOUNTER — Emergency Department (HOSPITAL_COMMUNITY)
Admission: EM | Admit: 2019-04-04 | Discharge: 2019-04-30 | Disposition: E | Payer: Medicare HMO | Attending: Emergency Medicine | Admitting: Emergency Medicine

## 2019-04-04 DIAGNOSIS — I469 Cardiac arrest, cause unspecified: Secondary | ICD-10-CM | POA: Insufficient documentation

## 2019-04-04 DIAGNOSIS — I10 Essential (primary) hypertension: Secondary | ICD-10-CM | POA: Diagnosis not present

## 2019-04-04 DIAGNOSIS — Z85038 Personal history of other malignant neoplasm of large intestine: Secondary | ICD-10-CM | POA: Insufficient documentation

## 2019-04-04 DIAGNOSIS — F17210 Nicotine dependence, cigarettes, uncomplicated: Secondary | ICD-10-CM | POA: Insufficient documentation

## 2019-04-04 DIAGNOSIS — Z7982 Long term (current) use of aspirin: Secondary | ICD-10-CM | POA: Diagnosis not present

## 2019-04-04 DIAGNOSIS — Z79899 Other long term (current) drug therapy: Secondary | ICD-10-CM | POA: Insufficient documentation

## 2019-04-04 DIAGNOSIS — Z8673 Personal history of transient ischemic attack (TIA), and cerebral infarction without residual deficits: Secondary | ICD-10-CM | POA: Diagnosis not present

## 2019-04-04 DIAGNOSIS — F17290 Nicotine dependence, other tobacco product, uncomplicated: Secondary | ICD-10-CM | POA: Insufficient documentation

## 2019-04-30 NOTE — ED Provider Notes (Signed)
[facility] EMERGENCY DEPARTMENT Provider Note   CSN: [id number] Arrival date & time: April 29, 2019  1457     History   Chief Complaint Chief Complaint  Patient presents with  . Cardiac Arrest    HPI Carlos SUPAN is a 79 y.o. male.     Patient arrested at home at 89.  Paramedics performed CPR with pneumonitis cardioversions and numerous epis along with amiodarone.  Patient never got a pulse back.  Paramedics worked on him for over an hour before arriving in the emergency department  The history is provided by the EMS personnel and a relative.  Cardiac Arrest Witnessed by:  Family member Incident location:  Home Time since incident:  65 minutes Time before BLS initiated:  > 5 minutes Time before ALS initiated:  > 10 minutes Condition upon EMS arrival:  Apneic Pulse:  Absent Initial cardiac rhythm per EMS:  Ventricular fibrillation Treatments prior to arrival:  ACLS protocol Medications given prior to ED:  Epinephrine Airway:  Combitube Rhythm on admission to ED:  Unchanged   Past Medical History:  Diagnosis Date  . 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 Colon cancer ([place])   . Coronary atherosclerosis of native coronary artery    PTCA to PDA 04/03/12, residual disease managed medically  . Essential hypertension, benign   . Gout   . Obesity   . Stroke [facility])     Patient Active Problem List   Diagnosis Date Noted  . Coronary atherosclerosis of native coronary artery 04/04/2012  . 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 Essential hypertension, benign 04/01/2012    Past Surgical History:  Procedure Laterality Date  . COLON SURGERY    . CORONARY ANGIOGRAM  04/03/2012   Procedure: CORONARY ANGIOGRAM;  Surgeon: Peter M Martinique, MD;  Location: [facility] CATH LAB;  Service: Cardiovascular;;  . PERCUTANEOUS CORONARY INTERVENTION-BALLOON ONLY  04/03/2012   Procedure: PERCUTANEOUS CORONARY INTERVENTION-BALLOON ONLY;  Surgeon: Peter  M Martinique, MD;  Location: [facility] CATH LAB;  Service: Cardiovascular;;  . RIGHT HEART CATHETERIZATION  04/03/2012   Procedure: RIGHT HEART CATH;  Surgeon: Peter M Martinique, MD;  Location: [facility] CATH LAB;  Service: Cardiovascular;;        Home Medications    Prior to Admission medications   Medication Sig Start Date End Date Taking? Authorizing Provider  aspirin 81 MG EC tablet Take 1 tablet (81 mg total) by mouth daily. 04/04/12   Dunn, Nedra Hai, PA-C  atorvastatin (LIPITOR) 40 MG tablet Take 1 tablet (40 mg total) by mouth daily. 03/01/13   Lendon Colonel, NP  carvedilol (COREG) 12.5 MG tablet Take 1 tablet (12.5 mg total) by mouth 2 (two) times daily with a meal. 03/01/13   Lendon Colonel, NP  dexamethasone (DECADRON) 4 MG tablet 1 po bid with food 05/26/13   Lily Kocher, PA-C  fish oil-omega-3 fatty acids 1000 MG capsule Take 2 g by mouth daily.    [provider]  furosemide (LASIX) 40 MG tablet Take 1 tablet (40 mg total) by mouth daily. 03/01/13   Lendon Colonel, NP  HYDROcodone-acetaminophen (NORCO) 5-325 MG per tablet Take 1 tablet by mouth every 6 (six) hours as needed for moderate pain. 05/26/13   Lily Kocher, PA-C  lisinopril (PRINIVIL,ZESTRIL) 10 MG tablet Take 1 tablet (10 mg total) by mouth daily. 03/01/13   Lendon Colonel, NP  Multiple Vitamin (MULTIVITAMIN WITH MINERALS) TABS Take 1 tablet by mouth daily.  [provider]  naproxen (NAPROSYN) 500 MG tablet Take 1 tablet (500 mg total) by mouth 2 (two) times daily. 05/26/13   Lily Kocher, PA-C  naproxen sodium (ANAPROX) 220 MG tablet Take 440 mg by mouth daily as needed (pain).    [provider]  nitroGLYCERIN (NITROSTAT) 0.4 MG SL tablet Place 1 tablet (0.4 mg total) under the tongue every 5 (five) minutes as needed for chest pain (up to 3 doses). 04/04/12   Dunn, Nedra Hai, PA-C  potassium chloride SA (K-DUR,KLOR-CON) 20 MEQ tablet Take 1 tablet (20 mEq total) by mouth daily. 07/21/13    Josue Hector, MD    Family History History reviewed. No pertinent family history.  Social History Social History   Tobacco Use  . Smoking status: Current Some Day Smoker    Types: Cigarettes, Cigars  . Smokeless tobacco: Current User    Types: Chew  . Tobacco comment: states he only chews on the cigars does not light them  Substance Use Topics  . Alcohol use: Yes    Comment: Liquor-half pint twice a week  . Drug use: No     Allergies   Patient has no known allergies.   Review of Systems Review of Systems  Unable to perform ROS: Intubated     Physical Exam Updated Vital Signs Ht 6' (1.829 m)   Wt 73.5 kg   BMI 21.98 kg/m   Physical Exam Vitals and nursing note reviewed.  Constitutional:      Comments: unconcious  HENT:     Head: Normocephalic.     Right Ear: Ear canal normal.     Nose: Nose normal.     Mouth/Throat:     Comments: inturbated Eyes:     Comments: Fixed and dialated pupils  Cardiovascular:     Comments: Vfib,  No pulses Pulmonary:     Comments: bagged Abdominal:     General: There is distension.  Musculoskeletal:        General: No signs of injury.     Cervical back: Neck supple.  Skin:    Coloration: Skin is pale.  Neurological:     Comments: Patient not responding to painful or verbal stimuli he was being bagged and having chest compressions done      ED Treatments / Results  Labs (all labs ordered are listed, but only abnormal results are displayed) Labs Reviewed - No data to display  EKG None  Radiology No results found.  Procedures Procedures (including critical care time)  Medications Ordered in ED Medications - No data to display   Initial Impression / Assessment and Plan / ED Course  I have reviewed the triage vital signs and the nursing notes.  Pertinent labs & imaging results that were available during my care of the patient were reviewed by me and considered in my medical decision making (see chart for  details).    CRITICAL CARE Performed by: Milton Ferguson Total critical care time: 25 minutes Critical care time was exclusive of separately billable procedures and treating other patients. Critical care was necessary to treat or prevent imminent or life-threatening deterioration. Critical care was time spent personally by me on the following activities: development of treatment plan with patient and/or surrogate as well as nursing, discussions with consultants, evaluation of patient's response to treatment, examination of patient, obtaining history from patient or surrogate, ordering and performing treatments and interventions, ordering and review of laboratory studies, ordering and review of radiographic studies, pulse oximetry and re-evaluation  of patient's condition.      Patient was in V. fib when seen in the emergency department he was shocked once and went into asystole.  He was coded for an hour and 15 minutes total and was pronounced dead at 2:59 PM.  Dr. Christel Mormon from [facility] stated he would take care of the death certificate  Final Clinical Impressions(s) / ED Diagnoses   Final diagnoses:  Cardiac arrest [facility])    ED Discharge Orders    None       Milton Ferguson, MD 04/09/19 1025

## 2019-04-30 NOTE — Code Documentation (Addendum)
Pt V. Fib on the monitor. Shock given per Dr. Roderic Palau.

## 2019-04-30 NOTE — ED Triage Notes (Signed)
Pt was witnessed arrest at 65 by his sister. EMS delivered at least 5 defibrillations for vfib along with 300mg  amiodarone and epi x 3. Shock delivered on arrival to ED. Pt then in asystole.

## 2019-04-30 NOTE — Code Documentation (Signed)
Pt asystole on the monitor. Time of death 37 per Dr. Roderic Palau.

## 2019-04-30 DEATH — deceased

## 2020-05-14 IMAGING — CR DG CHEST 2V
1 series · 3 of 3 positions shown · non-contrast
Comparison: 04/02/2012

CLINICAL DATA: Shortness of breath. Abnormal EKG findings.

EXAM:
CHEST - 2 VIEW

[Series 1: dg chest 2 view · 0.14mm/px · 3 of 3 slices shown]
[im 1/3]
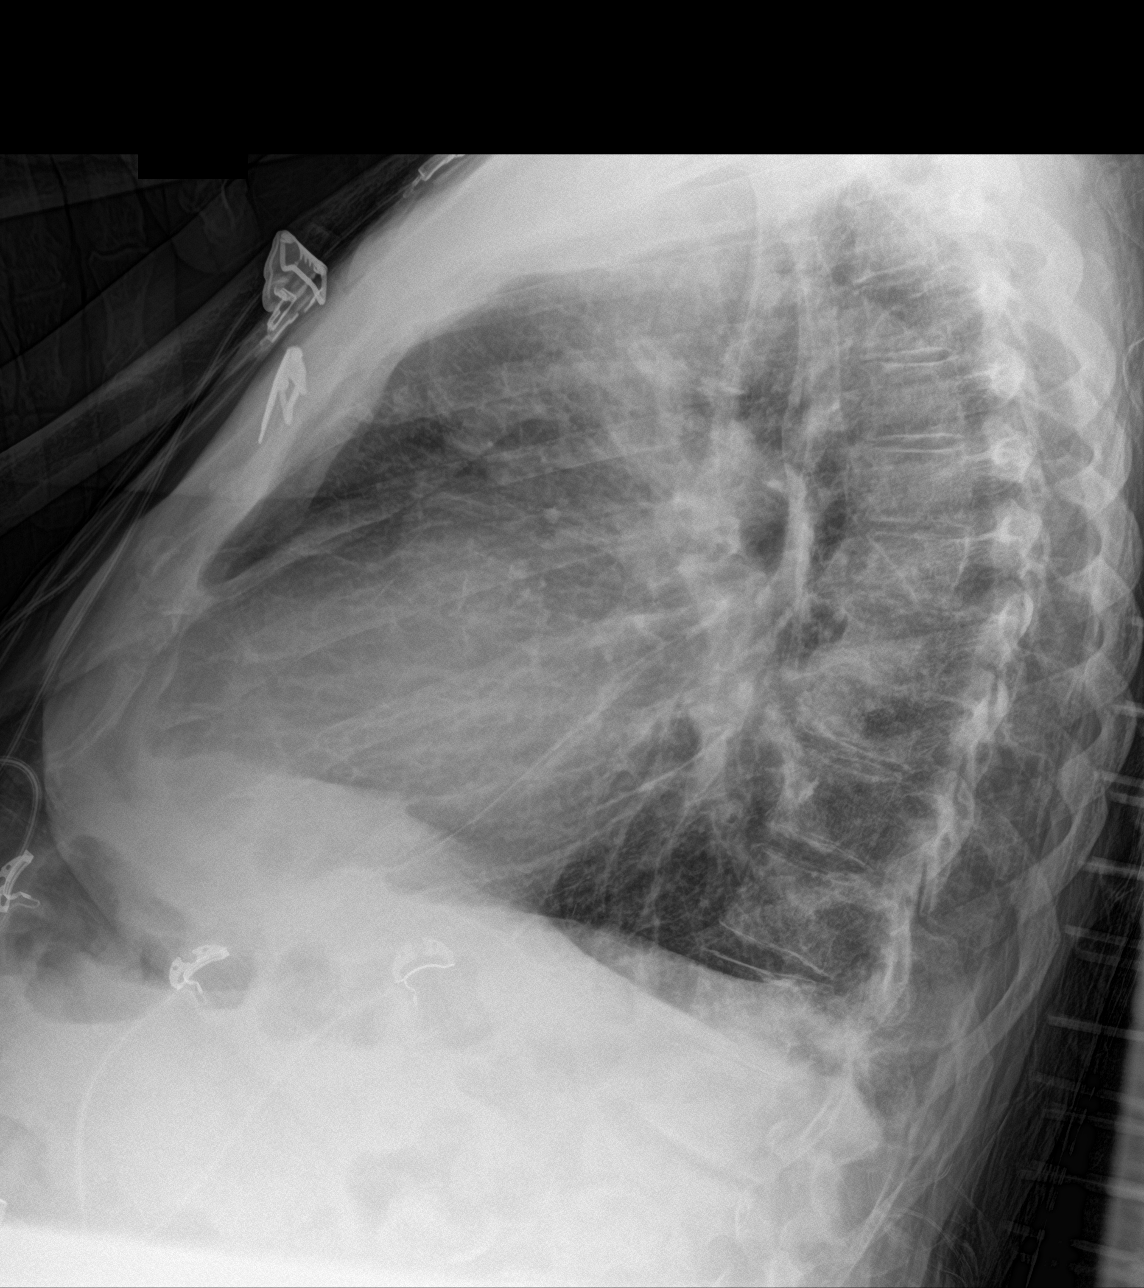
[im 2/3]
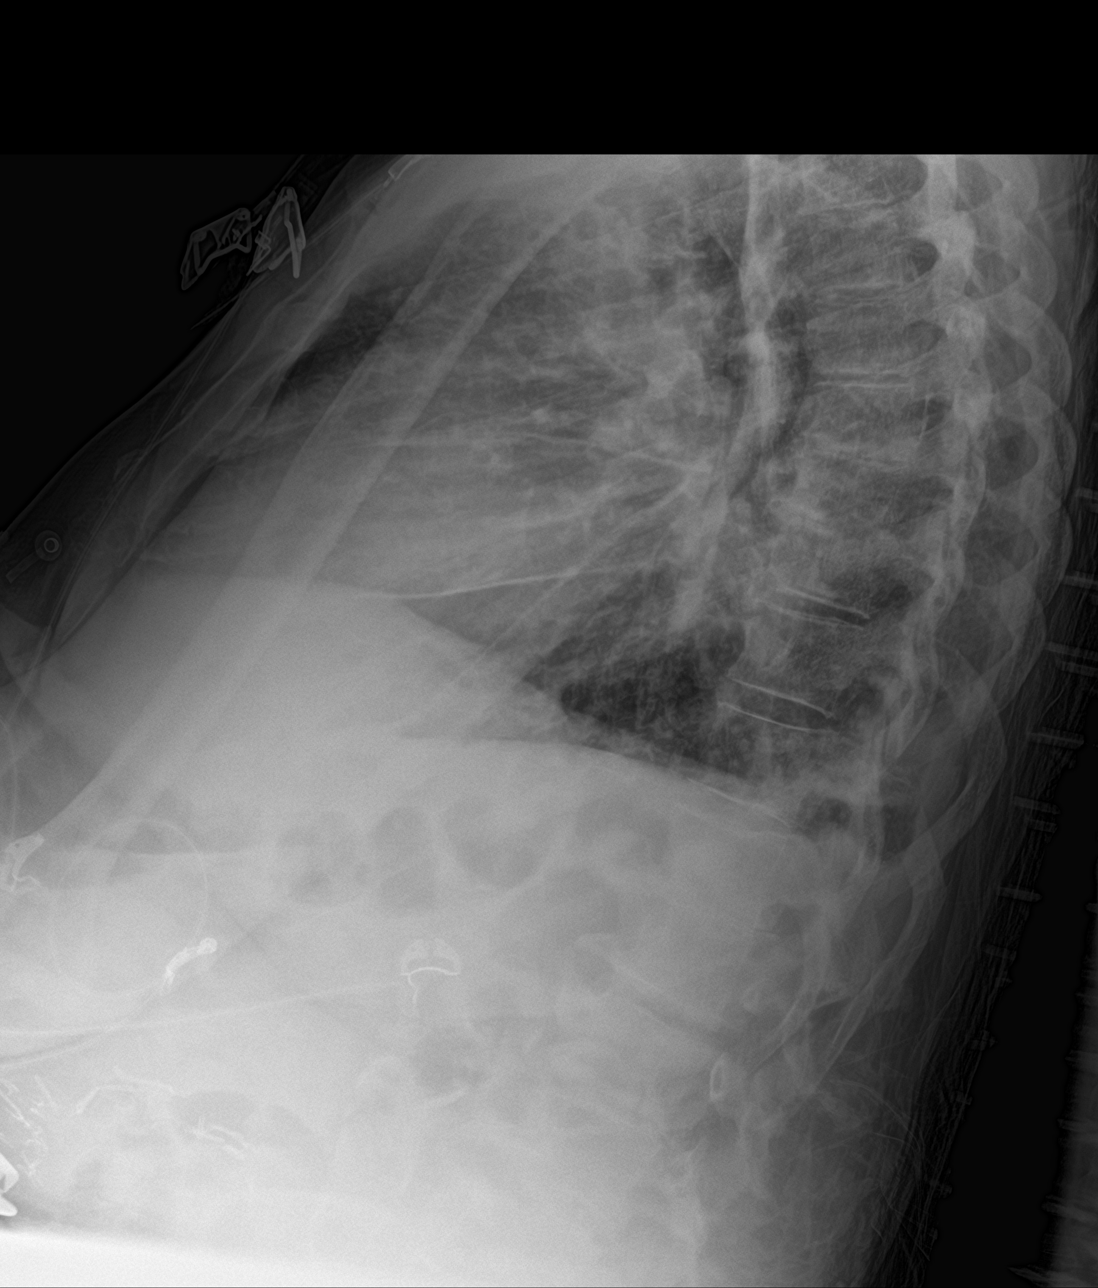
[im 3/3]
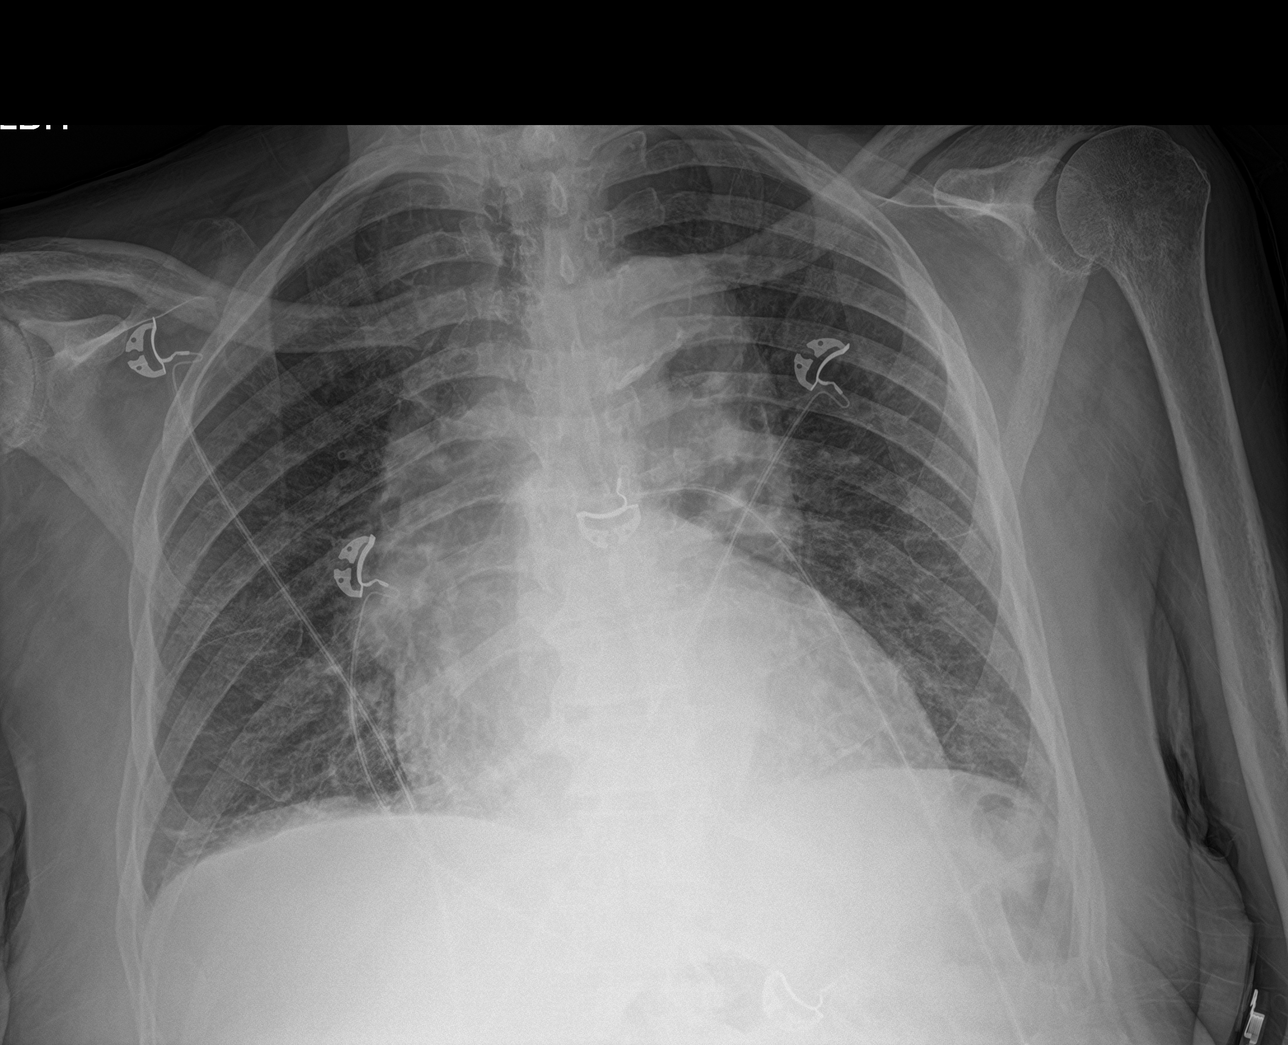

[3 of 3 positions shown; findings below may reference images not displayed]

FINDINGS: Stable cardiomegaly. Aortic calcified and tortuous. No focal
airspace consolidation. No pleural effusion or pneumothorax.
IMPRESSION: Stable cardiomegaly. No active pulmonary disease.
